# Patient Record
Sex: Female | Born: 1965 | Race: White | Hispanic: No | Marital: Married | Smoking: Never smoker
Health system: Southern US, Community
[De-identification: ages and names within clinical notes are randomized; demographics above are authoritative.]

## PROBLEM LIST (undated history)

## (undated) DIAGNOSIS — K219 Gastro-esophageal reflux disease without esophagitis: Secondary | ICD-10-CM

## (undated) DIAGNOSIS — D249 Benign neoplasm of unspecified breast: Secondary | ICD-10-CM

## (undated) DIAGNOSIS — T7840XA Allergy, unspecified, initial encounter: Secondary | ICD-10-CM

## (undated) DIAGNOSIS — F329 Major depressive disorder, single episode, unspecified: Secondary | ICD-10-CM

## (undated) DIAGNOSIS — F419 Anxiety disorder, unspecified: Secondary | ICD-10-CM

## (undated) DIAGNOSIS — F32A Depression, unspecified: Secondary | ICD-10-CM

## (undated) DIAGNOSIS — Z803 Family history of malignant neoplasm of breast: Secondary | ICD-10-CM

## (undated) DIAGNOSIS — Z9189 Other specified personal risk factors, not elsewhere classified: Secondary | ICD-10-CM

## (undated) DIAGNOSIS — G43909 Migraine, unspecified, not intractable, without status migrainosus: Secondary | ICD-10-CM

## (undated) HISTORY — DX: Anxiety disorder, unspecified: F41.9

## (undated) HISTORY — PX: TONSILLECTOMY AND ADENOIDECTOMY: SHX28

## (undated) HISTORY — DX: Allergy, unspecified, initial encounter: T78.40XA

## (undated) HISTORY — DX: Depression, unspecified: F32.A

## (undated) HISTORY — DX: Migraine, unspecified, not intractable, without status migrainosus: G43.909

## (undated) HISTORY — DX: Major depressive disorder, single episode, unspecified: F32.9

## (undated) HISTORY — DX: Benign neoplasm of unspecified breast: D24.9

## (undated) HISTORY — DX: Other specified personal risk factors, not elsewhere classified: Z91.89

## (undated) HISTORY — DX: Gastro-esophageal reflux disease without esophagitis: K21.9

## (undated) HISTORY — PX: INTRAUTERINE DEVICE INSERTION: SHX323

## (undated) HISTORY — DX: Family history of malignant neoplasm of breast: Z80.3

---

## 1999-01-23 ENCOUNTER — Other Ambulatory Visit: Admission: RE | Admit: 1999-01-23 | Discharge: 1999-01-23 | Payer: Self-pay | Admitting: Obstetrics and Gynecology

## 1999-04-25 ENCOUNTER — Encounter: Payer: Self-pay | Admitting: Obstetrics and Gynecology

## 1999-04-25 ENCOUNTER — Ambulatory Visit (HOSPITAL_COMMUNITY): Admission: RE | Admit: 1999-04-25 | Discharge: 1999-04-25 | Payer: Self-pay | Admitting: Obstetrics and Gynecology

## 1999-04-25 ENCOUNTER — Encounter (INDEPENDENT_AMBULATORY_CARE_PROVIDER_SITE_OTHER): Payer: Self-pay | Admitting: Specialist

## 1999-05-19 DIAGNOSIS — D249 Benign neoplasm of unspecified breast: Secondary | ICD-10-CM

## 1999-05-19 HISTORY — DX: Benign neoplasm of unspecified breast: D24.9

## 1999-08-03 ENCOUNTER — Inpatient Hospital Stay (HOSPITAL_COMMUNITY): Admission: AD | Admit: 1999-08-03 | Discharge: 1999-08-05 | Payer: Self-pay | Admitting: Obstetrics and Gynecology

## 1999-08-03 ENCOUNTER — Encounter: Payer: Self-pay | Admitting: Obstetrics and Gynecology

## 1999-08-07 ENCOUNTER — Encounter: Admission: RE | Admit: 1999-08-07 | Discharge: 1999-11-05 | Payer: Self-pay | Admitting: Obstetrics and Gynecology

## 1999-09-12 ENCOUNTER — Other Ambulatory Visit: Admission: RE | Admit: 1999-09-12 | Discharge: 1999-09-12 | Payer: Self-pay | Admitting: Obstetrics and Gynecology

## 2000-10-27 ENCOUNTER — Other Ambulatory Visit: Admission: RE | Admit: 2000-10-27 | Discharge: 2000-10-27 | Payer: Self-pay | Admitting: Obstetrics and Gynecology

## 2001-07-11 ENCOUNTER — Other Ambulatory Visit: Admission: RE | Admit: 2001-07-11 | Discharge: 2001-07-11 | Payer: Self-pay | Admitting: Obstetrics and Gynecology

## 2002-01-14 ENCOUNTER — Inpatient Hospital Stay (HOSPITAL_COMMUNITY): Admission: AD | Admit: 2002-01-14 | Discharge: 2002-01-16 | Payer: Self-pay | Admitting: Obstetrics and Gynecology

## 2002-02-15 ENCOUNTER — Other Ambulatory Visit: Admission: RE | Admit: 2002-02-15 | Discharge: 2002-02-15 | Payer: Self-pay | Admitting: Obstetrics and Gynecology

## 2003-01-25 ENCOUNTER — Other Ambulatory Visit: Admission: RE | Admit: 2003-01-25 | Discharge: 2003-01-25 | Payer: Self-pay | Admitting: Gynecology

## 2003-09-05 ENCOUNTER — Ambulatory Visit (HOSPITAL_COMMUNITY): Admission: RE | Admit: 2003-09-05 | Discharge: 2003-09-05 | Payer: Self-pay | Admitting: Family Medicine

## 2005-05-07 ENCOUNTER — Other Ambulatory Visit: Admission: RE | Admit: 2005-05-07 | Discharge: 2005-05-07 | Payer: Self-pay | Admitting: Obstetrics and Gynecology

## 2005-06-01 ENCOUNTER — Encounter: Admission: RE | Admit: 2005-06-01 | Discharge: 2005-06-01 | Payer: Self-pay | Admitting: Obstetrics & Gynecology

## 2005-11-20 ENCOUNTER — Encounter: Admission: RE | Admit: 2005-11-20 | Discharge: 2005-11-20 | Payer: Self-pay | Admitting: Obstetrics & Gynecology

## 2006-05-17 ENCOUNTER — Other Ambulatory Visit: Admission: RE | Admit: 2006-05-17 | Discharge: 2006-05-17 | Payer: Self-pay | Admitting: Obstetrics & Gynecology

## 2007-05-09 ENCOUNTER — Encounter: Admission: RE | Admit: 2007-05-09 | Discharge: 2007-05-09 | Payer: Self-pay | Admitting: Obstetrics & Gynecology

## 2007-06-06 ENCOUNTER — Other Ambulatory Visit: Admission: RE | Admit: 2007-06-06 | Discharge: 2007-06-06 | Payer: Self-pay | Admitting: Obstetrics & Gynecology

## 2008-05-14 ENCOUNTER — Encounter: Admission: RE | Admit: 2008-05-14 | Discharge: 2008-05-14 | Payer: Self-pay | Admitting: Obstetrics & Gynecology

## 2009-06-03 ENCOUNTER — Encounter: Admission: RE | Admit: 2009-06-03 | Discharge: 2009-06-03 | Payer: Self-pay | Admitting: Obstetrics & Gynecology

## 2010-07-07 ENCOUNTER — Other Ambulatory Visit: Payer: Self-pay | Admitting: Obstetrics & Gynecology

## 2010-07-07 DIAGNOSIS — Z1231 Encounter for screening mammogram for malignant neoplasm of breast: Secondary | ICD-10-CM

## 2010-07-17 ENCOUNTER — Ambulatory Visit
Admission: RE | Admit: 2010-07-17 | Discharge: 2010-07-17 | Disposition: A | Discharge status: Home / Self Care | Payer: BC Managed Care – PPO | Source: Ambulatory Visit | Attending: Obstetrics & Gynecology | Admitting: Obstetrics & Gynecology

## 2010-07-17 DIAGNOSIS — Z1231 Encounter for screening mammogram for malignant neoplasm of breast: Secondary | ICD-10-CM

## 2011-06-19 ENCOUNTER — Other Ambulatory Visit: Payer: Self-pay | Admitting: Obstetrics & Gynecology

## 2011-06-19 DIAGNOSIS — Z1231 Encounter for screening mammogram for malignant neoplasm of breast: Secondary | ICD-10-CM

## 2011-08-19 ENCOUNTER — Ambulatory Visit
Admission: RE | Admit: 2011-08-19 | Discharge: 2011-08-19 | Disposition: A | Discharge status: Home / Self Care | Payer: BC Managed Care – PPO | Source: Ambulatory Visit | Attending: Obstetrics & Gynecology | Admitting: Obstetrics & Gynecology

## 2011-08-19 DIAGNOSIS — Z1231 Encounter for screening mammogram for malignant neoplasm of breast: Secondary | ICD-10-CM

## 2011-12-17 ENCOUNTER — Ambulatory Visit (INDEPENDENT_AMBULATORY_CARE_PROVIDER_SITE_OTHER): Payer: BC Managed Care – PPO | Admitting: Family Medicine

## 2011-12-17 ENCOUNTER — Encounter: Payer: Self-pay | Admitting: Family Medicine

## 2011-12-17 VITALS — BP 130/78 | HR 60 | Ht 66.25 in | Wt 122.0 lb

## 2011-12-17 DIAGNOSIS — R5383 Other fatigue: Secondary | ICD-10-CM

## 2011-12-17 DIAGNOSIS — Z0000 Encounter for general adult medical examination without abnormal findings: Secondary | ICD-10-CM

## 2011-12-17 DIAGNOSIS — Z8349 Family history of other endocrine, nutritional and metabolic diseases: Secondary | ICD-10-CM

## 2011-12-17 DIAGNOSIS — Z83438 Family history of other disorder of lipoprotein metabolism and other lipidemia: Secondary | ICD-10-CM

## 2011-12-17 DIAGNOSIS — R5381 Other malaise: Secondary | ICD-10-CM

## 2011-12-17 LAB — CBC WITH DIFFERENTIAL/PLATELET
Basophils Absolute: 0.1 10*3/uL (ref 0.0–0.1)
Eosinophils Absolute: 0.3 10*3/uL (ref 0.0–0.7)
Eosinophils Relative: 5 % (ref 0–5)
HCT: 39 % (ref 36.0–46.0)
MCH: 30.7 pg (ref 26.0–34.0)
MCHC: 33.8 g/dL (ref 30.0–36.0)
Platelets: 165 10*3/uL (ref 150–400)
RDW: 12.9 % (ref 11.5–15.5)

## 2011-12-17 LAB — POCT URINALYSIS DIPSTICK
Ketones, UA: NEGATIVE
Protein, UA: NEGATIVE
Spec Grav, UA: <=1.005
Urobilinogen, UA: NEGATIVE

## 2011-12-17 NOTE — Progress Notes (Signed)
Chief Complaint  Patient presents with  . Annual Exam    new pt physical no pap. pt had eyes check last month by eye doctor. last pap smear was done in 8/12. mammogram done back in feburary   She has no specific complaints or concerns.  Health maintenance: There is no immunization history on file for this patient. Gets flu shots yearly. Recalls having vaccines through GYN about 6-7 years ago Last Pap smear: 12/2010, has appt this month with GYN Last mammogram: yearly, done in 06/2011 Last colonoscopy: never Last DEXA: never Dentist: twice yearly Ophtho: yearly, went in June Exercise: runs 5x/week x 30 minutes Had cholesterol checked though her GYN, it was good, but may have been about 5 years ago.  Past Medical History  Diagnosis Date  . Migraine     in her 20's, improved after having children    Past Surgical History  Procedure Date  . Tonsillectomy and adenoidectomy     History   Social History  . Marital Status: Married    Spouse Name: N/A    Number of Children: 2  . Years of Education: N/A   Occupational History  . 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 Smoking status: Never Smoker   . Smokeless tobacco: Never Used  . Alcohol Use: Yes     0-2 drinks/week  . Drug Use: No  . Sexually Active: Yes -- Female partner(s)    Birth Control/ Protection: IUD     paragard   Other Topics Concern  . 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 Married    Family History  Problem Relation Age of Onset  . Hypertension Mother   . Breast cancer Mother     19's  . Thyroid disease Mother   . Hyperlipidemia Mother   . Hyperlipidemia Father   . Heart disease Maternal Uncle   . Heart disease Paternal Uncle   . Breast cancer Other    Current outpatient prescriptions:aspirin-acetaminophen-caffeine (EXCEDRIN MIGRAINE) 250-250-65 MG per tablet, Take 1 tablet by mouth every 6 (six) hours as  needed., Disp: , Rfl: ;  Calcium-Vitamin D-Vitamin K 500-100-40 MG-UNT-MCG CHEW, Chew by mouth daily., Disp: , Rfl: ;  Flaxseed, Linseed, 1300 MG CAPS, Take by mouth., Disp: , Rfl: ;  ibuprofen (ADVIL,MOTRIN) 200 MG tablet, Take 200 mg by mouth every 6 (six) hours as needed., Disp: , Rfl:  Multiple Vitamin (MULTI-VITAMIN) tablet, Take 1 tablet by mouth daily., Disp: , Rfl:   No Known Allergies  ROS:  The patient denies anorexia, fever, weight changes,  vision changes, decreased hearing, ear pain, sore throat, breast concerns, chest pain, palpitations, dizziness, syncope, dyspnea on exertion, cough, swelling, nausea, vomiting, diarrhea, constipation, abdominal pain, melena, hematochezia, indigestion/heartburn, hematuria, incontinence, dysuria, irregular menstrual cycles, vaginal discharge, odor or itch, genital lesions, joint pains, numbness, tingling, weakness, tremor, suspicious skin lesions, depression, anxiety, abnormal bleeding/bruising, or enlarged lymph nodes. +occasional mild headaches, relieved by Excedrin migraine.  Hasn't had a full migraine in years. Sometimes is very fatigued--more during school year, likely related to inadequate amount of sleep  PHYSICAL EXAM: BP 130/78  Pulse 60  Ht 5' 6.25" (1.683 m)  Wt 122 lb (55.339 kg)  BMI 19.54 kg/m2  General Appearance:    Alert, cooperative, no distress, appears stated age  Head:    Normocephalic, without obvious abnormality, atraumatic  Eyes:    PERRL, conjunctiva/corneas clear, EOM's intact, fundi  benign  Ears:    Normal TM's and external ear canals  Nose:   Nares normal, mucosa normal, no drainage or sinus   tenderness  Throat:   Lips, mucosa, and tongue normal; teeth and gums normal  Neck:   Supple, no lymphadenopathy;  thyroid:  no   enlargement/tenderness/nodules; no carotid   bruit or JVD  Back:    Spine nontender, no curvature, ROM normal, no CVA     tenderness  Lungs:     Clear to auscultation bilaterally without wheezes,  rales or     ronchi; respirations unlabored  Chest Wall:    No tenderness or deformity   Heart:    Regular rate and rhythm, S1 and S2 normal, no murmur, rub   or gallop  Breast Exam:    Deferred to GYN  Abdomen:     Soft, non-tender, nondistended, normoactive bowel sounds,    no masses, no hepatosplenomegaly  Genitalia:    Deferred to GYN     Extremities:   No clubbing, cyanosis or edema  Pulses:   2+ and symmetric all extremities  Skin:   Skin color, texture, turgor normal, no rashes or lesions  Lymph nodes:   Cervical, supraclavicular, and axillary nodes normal  Neurologic:   CNII-XII intact, normal strength, sensation and gait; reflexes 2+ and symmetric throughout          Psych:   Normal mood, affect, hygiene and grooming.     ASSESSMENT/PLAN: 1. Routine general medical examination at a health care facility  POCT Urinalysis Dipstick  2. Other malaise and fatigue  Comprehensive metabolic panel, CBC with Differential, Vitamin D 25 hydroxy, TSH  3. Family history of hyperlipidemia  Lipid panel   Check GYN records for Tdap. If not UTD, return for nurse visit (vs getting at GYN appt later this month)  Discussed monthly self breast exams and yearly mammograms after the age of 57; at least 30 minutes of aerobic activity at least 5 days/week; proper sunscreen use reviewed; healthy diet, including goals of calcium and vitamin D intake and alcohol recommendations (less than or equal to 1 drink/day) reviewed; regular seatbelt use; changing batteries in smoke detectors.  Immunization recommendations discussed.  Colonoscopy recommendations reviewed--age 35 unless family h/o adenomatous polyps or cancers  F/u prn

## 2011-12-17 NOTE — Patient Instructions (Signed)

## 2011-12-18 ENCOUNTER — Encounter: Payer: Self-pay | Admitting: Family Medicine

## 2011-12-18 LAB — COMPREHENSIVE METABOLIC PANEL
Albumin: 4.2 g/dL (ref 3.5–5.2)
BUN: 13 mg/dL (ref 6–23)
CO2: 27 mEq/L (ref 19–32)
Calcium: 8.8 mg/dL (ref 8.4–10.5)
Chloride: 105 mEq/L (ref 96–112)
Creat: 0.76 mg/dL (ref 0.50–1.10)
Glucose, Bld: 71 mg/dL (ref 70–99)
Sodium: 140 mEq/L (ref 135–145)
Total Bilirubin: 0.4 mg/dL (ref 0.3–1.2)

## 2011-12-18 LAB — VITAMIN D 25 HYDROXY (VIT D DEFICIENCY, FRACTURES): Vit D, 25-Hydroxy: 62 ng/mL (ref 30–89)

## 2011-12-18 LAB — LIPID PANEL
Cholesterol: 144 mg/dL (ref 0–200)
HDL: 54 mg/dL (ref >39)
LDL Cholesterol: 83 mg/dL (ref 0–99)

## 2012-11-02 ENCOUNTER — Ambulatory Visit: Payer: Self-pay | Admitting: Certified Nurse Midwife

## 2012-11-02 ENCOUNTER — Encounter: Payer: Self-pay | Admitting: Certified Nurse Midwife

## 2012-11-03 ENCOUNTER — Encounter: Payer: Self-pay | Admitting: Certified Nurse Midwife

## 2012-11-03 ENCOUNTER — Ambulatory Visit (INDEPENDENT_AMBULATORY_CARE_PROVIDER_SITE_OTHER): Payer: BC Managed Care – PPO | Admitting: Certified Nurse Midwife

## 2012-11-03 VITALS — BP 98/60 | HR 58 | Temp 97.9°F | Resp 12 | Ht 65.75 in | Wt 115.0 lb

## 2012-11-03 DIAGNOSIS — N390 Urinary tract infection, site not specified: Secondary | ICD-10-CM

## 2012-11-03 LAB — POCT URINALYSIS DIPSTICK: Leukocytes, UA: NEGATIVE

## 2012-11-03 MED ORDER — NITROFURANTOIN MONOHYD MACRO 100 MG PO CAPS: 100.0000 mg | ORAL_CAPSULE | Freq: Two times a day (BID) | ORAL | Status: DC

## 2012-11-03 NOTE — Progress Notes (Signed)
S:  @46  y.o.Married Caucasian female presents with UTI  symptoms of dysuria, urinary frequency, urinary urgency for the past 3 days, which onset occurred after sexually activity. Symptoms are better today,but still some frequency, but no pain now. Denies fever, chills,headache,or back pain. Last UTI was 2009 with E.Coli, had similar occurrence last year but resolved without medication use. ROS: feels well  O alert, oriented to person, place, and time   well developed and well nourished healthy female  Exam: abdomen: non tender, negative suprapubic  CVAT: negative bilateral  Pelvic exam: External genital: normal, no lesions  BUS/bladder: non tender, urinary meatus slightly red, nontender  Vagina: normal, normal discharge  Cervix: normal, non tender IUD string noted  Uterus:normal, non tender  Adnexa: normal, non tender, no masses   Diagnostic Test:    Urinalysis: essentially negative, Clear   urine culture, urine micro  Assessment:Normal pelvic exam  Possible post coital UTI History of E.Coli post coital UTI     Plan:Reviewed findings of possible UTI, since symptoms are improving Rx Macrobid see order, Discussed with patient to take one po today and hold prescription if symptoms have resolved and not increasing. If culture shows positive will treat. Discussed post coital use to prevent infection. Questions addressed. Aware of UTI warning signs and symptoms.   RVprn      Reviewed, TL

## 2012-11-04 LAB — URINALYSIS, MICROSCOPIC ONLY: Crystals: NONE SEEN

## 2012-11-24 ENCOUNTER — Other Ambulatory Visit: Payer: Self-pay | Admitting: Obstetrics & Gynecology

## 2012-11-24 DIAGNOSIS — Z1231 Encounter for screening mammogram for malignant neoplasm of breast: Secondary | ICD-10-CM

## 2012-12-14 ENCOUNTER — Ambulatory Visit (HOSPITAL_COMMUNITY)
Admission: RE | Admit: 2012-12-14 | Discharge: 2012-12-14 | Disposition: A | Discharge status: Home / Self Care | Payer: BC Managed Care – PPO | Source: Ambulatory Visit | Attending: Obstetrics & Gynecology | Admitting: Obstetrics & Gynecology

## 2012-12-14 DIAGNOSIS — Z1231 Encounter for screening mammogram for malignant neoplasm of breast: Secondary | ICD-10-CM | POA: Insufficient documentation

## 2012-12-15 ENCOUNTER — Other Ambulatory Visit: Payer: Self-pay | Admitting: Obstetrics & Gynecology

## 2012-12-15 DIAGNOSIS — R928 Other abnormal and inconclusive findings on diagnostic imaging of breast: Secondary | ICD-10-CM

## 2012-12-22 ENCOUNTER — Ambulatory Visit
Admission: RE | Admit: 2012-12-22 | Discharge: 2012-12-22 | Disposition: A | Discharge status: Home / Self Care | Payer: BC Managed Care – PPO | Source: Ambulatory Visit | Attending: Obstetrics & Gynecology | Admitting: Obstetrics & Gynecology

## 2012-12-22 ENCOUNTER — Other Ambulatory Visit: Payer: Self-pay | Admitting: Obstetrics & Gynecology

## 2012-12-22 DIAGNOSIS — R921 Mammographic calcification found on diagnostic imaging of breast: Secondary | ICD-10-CM

## 2012-12-22 DIAGNOSIS — R928 Other abnormal and inconclusive findings on diagnostic imaging of breast: Secondary | ICD-10-CM

## 2013-01-02 ENCOUNTER — Ambulatory Visit
Admission: RE | Admit: 2013-01-02 | Discharge: 2013-01-02 | Disposition: A | Discharge status: Home / Self Care | Payer: BC Managed Care – PPO | Source: Ambulatory Visit | Attending: Obstetrics & Gynecology | Admitting: Obstetrics & Gynecology

## 2013-01-02 DIAGNOSIS — R921 Mammographic calcification found on diagnostic imaging of breast: Secondary | ICD-10-CM

## 2013-01-02 HISTORY — PX: BREAST BIOPSY: SHX20

## 2013-08-10 ENCOUNTER — Encounter: Payer: Self-pay | Admitting: Family Medicine

## 2013-08-10 ENCOUNTER — Ambulatory Visit (INDEPENDENT_AMBULATORY_CARE_PROVIDER_SITE_OTHER): Payer: BC Managed Care – PPO | Admitting: Family Medicine

## 2013-08-10 VITALS — BP 114/76 | HR 68 | Temp 98.2°F | Ht 65.5 in | Wt 119.0 lb

## 2013-08-10 DIAGNOSIS — R0600 Dyspnea, unspecified: Secondary | ICD-10-CM

## 2013-08-10 DIAGNOSIS — R0989 Other specified symptoms and signs involving the circulatory and respiratory systems: Secondary | ICD-10-CM

## 2013-08-10 DIAGNOSIS — R0609 Other forms of dyspnea: Secondary | ICD-10-CM

## 2013-08-10 NOTE — Progress Notes (Signed)
Chief Complaint  Patient presents with  . Chest Pain    feels like she cannot take a "full breath" x 1 week. Does have a headache today but she states that she does get HA's frequently.    Over the last week she has had a slight discomfort in her chest, like when you start to get a URI.  She kept waiting for other symptoms to start (like runny nose, cough). She feels like she can't take a full deep breath.  This morning she felt like her pulse was faster than normal.  She felt "weird" when taking her daughters to the dermatologist, so she had her BP checked.  BP was checked multiple times, and was a little high.  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  She denies heartburn, indigestion, belching.   She has a headache at the top of her head, 5-6/10 intensity.  Not different from typical headaches that she gets, which usually responds nicely to excedrin.  She does think she clenches her teeth intermittently.  Past Medical History  Diagnosis Date  . Migraine     in her 20's, improved after having children  . Adenoma of breast     right lactational adenoma   Past Surgical History  Procedure Laterality Date  . Tonsillectomy and adenoidectomy    . Intrauterine device insertion      inserted 7/09   History   Social History  . Marital Status: Married    Spouse Name: N/A    Number of Children: 2  . Years of Education: N/A   Occupational History  . 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 Smoking status: Never Smoker   . Smokeless tobacco: Never Used  . Alcohol Use: Yes     Comment: 0-2 drinks/week  . Drug Use: No  . Sexual Activity: Yes    Partners: Male    Birth Control/ Protection: IUD     Comment: paragard   Other Topics Concern  . 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  Calcium-Vitamin D-Vitamin K 539-767-34 MG-UNT-MCG CHEW Chew by mouth daily.  . Flaxseed, Linseed, 1300 MG CAPS Take by mouth.  . Multiple Vitamin (MULTI-VITAMIN) tablet Take 1 tablet by mouth daily.  Marland Kitchen PARAGARD INTRAUTERINE COPPER IU by Intrauterine route.  Marland Kitchen aspirin-acetaminophen-caffeine (EXCEDRIN MIGRAINE) 250-250-65 MG per tablet Take 1 tablet by mouth every 6 (six) hours as needed.  Marland Kitchen ibuprofen (ADVIL,MOTRIN) 200 MG tablet Take 200 mg by mouth every 6 (six) hours as needed.  . [DISCONTINUED] nitrofurantoin, macrocrystal-monohydrate, (MACROBID) 100 MG capsule Take 1 capsule (100 mg total) by mouth 2 (two) times daily.   No Known Allergies  ROS: Denies fevers, chills, nausea, vomiting, diarrhea.  Denies edema, calf pain, no recent surgery or inactivity. No family h/o DVT's.   No sick contacts.  No allergy symptoms, or URI, cough. No bleeding, bruising, rashes, joint pain, myalgias. No palpitations  PHYSICAL EXAM: BP 140/88  Pulse 68  Temp(Src) 98.2 F (36.8 C) (Oral)  Ht 5' 5.5" (1.664 m)  Wt 119 lb (53.978 kg)  BMI 19.49 kg/m2  SpO2 98%  LMP 07/24/2013 114/76 on repeat by MD Well developed, pleasant female, in no distress.  Appears comfortable HEENT:  PERRL, EOMI, conjunctiva clear.  TM's and EAC's normal.  Nasal mucosa mod edematous, no erythema or  drainage.  Sinuses nontender.  OP clear Neck: no lymphadenopathy, thyromegaly or mass Heart: regular rate and rhythm without murmur Chest: nontender to palpation Abdomen: soft, nontender, no organomegaly or mass. Extremities: no edema, nontender, negative Homan Neuro: alert and oriented, normal strength, gait Psych: normal mood, affect, hygiene and grooming.   EKG:  NSR, rate 60.  No abnormalities noted  ASSESSMENT/PLAN:  Dyspnea - Plan: EKG 12-Lead  Your lung exam was normal.  Only finding was some nasal congestion, so there might be some drainage causing chest congestion for your symptoms.  You can see if mucinex helps (and  or claritin for allergies if symptoms develop).  Reflux can cause similar symptoms--if you become aware of heartburn or reflux, you can try Prilosec OTC.  It is very possible that stress is contributing--that should get better in the next few days.  Go to the ER if you get worsening shortness of breath, chest pain, otherwise follow up next week if you aren't improving.

## 2013-08-10 NOTE — Patient Instructions (Addendum)
  Your lung exam was normal.  Only finding was some nasal congestion, so there might be some drainage causing chest congestion for your symptoms.  You can see if mucinex helps.  Reflux can cause similar symptoms--if you become aware of heartburn or reflux, you can try Prilosec OTC.  It is very possible that stress is contributing--that should get better in the next few days.  Go to the ER if you get worsening shortness of breath, chest pain, otherwise follow up next week if you aren't improving.  Your blood pressure was normal on the repeat testing (114/76)

## 2013-11-06 ENCOUNTER — Ambulatory Visit: Payer: BC Managed Care – PPO | Admitting: Nurse Practitioner

## 2013-12-12 ENCOUNTER — Other Ambulatory Visit: Payer: Self-pay

## 2013-12-12 DIAGNOSIS — Z1231 Encounter for screening mammogram for malignant neoplasm of breast: Secondary | ICD-10-CM

## 2013-12-25 ENCOUNTER — Ambulatory Visit
Admission: RE | Admit: 2013-12-25 | Discharge: 2013-12-25 | Disposition: A | Discharge status: Home / Self Care | Payer: BC Managed Care – PPO | Source: Ambulatory Visit

## 2013-12-25 DIAGNOSIS — Z1231 Encounter for screening mammogram for malignant neoplasm of breast: Secondary | ICD-10-CM

## 2013-12-28 ENCOUNTER — Encounter: Payer: Self-pay | Admitting: Nurse Practitioner

## 2013-12-28 ENCOUNTER — Ambulatory Visit (INDEPENDENT_AMBULATORY_CARE_PROVIDER_SITE_OTHER): Payer: BC Managed Care – PPO | Admitting: Nurse Practitioner

## 2013-12-28 VITALS — BP 120/72 | HR 64 | Resp 16 | Ht 66.0 in | Wt 119.0 lb

## 2013-12-28 DIAGNOSIS — Z01419 Encounter for gynecological examination (general) (routine) without abnormal findings: Secondary | ICD-10-CM

## 2013-12-28 DIAGNOSIS — Z803 Family history of malignant neoplasm of breast: Secondary | ICD-10-CM | POA: Insufficient documentation

## 2013-12-28 DIAGNOSIS — Z0000 Encounter for general adult medical examination without abnormal findings: Secondary | ICD-10-CM

## 2013-12-28 LAB — POCT URINALYSIS DIPSTICK
Bilirubin, UA: NEGATIVE
GLUCOSE UA: NEGATIVE
Ketones, UA: NEGATIVE
LEUKOCYTES UA: NEGATIVE
Nitrite, UA: NEGATIVE
PROTEIN UA: NEGATIVE
Urobilinogen, UA: NEGATIVE
pH, UA: 6.5

## 2013-12-28 LAB — HEMOGLOBIN, FINGERSTICK: HEMOGLOBIN, FINGERSTICK: 13.2 g/dL (ref 12.0–16.0)

## 2013-12-28 NOTE — Patient Instructions (Addendum)

## 2013-12-28 NOTE — Progress Notes (Signed)
Patient ID: Annette Byrd, female   DOB: 1966-05-05, 48 y.o.   MRN: [id number] 48 y.o. G1P2002 Married Caucasian Fe here for annual exam.  Menses is regular but last 7-10 days.  She has spotting that goes on several days.   She did have a left breast core biopsy last August which was benign and only calcifications and FCB changes.  Sister age 69 with a new diagnosis of breast cancer.  She lives in [place] and had a Lumpectomy and will be having radiation.  She is not sure but thinks it was stage O and maybe DCIS.  Patient's last menstrual period was 12/21/2013.          Sexually active: Yes.    The current method of family planning is ParaGard IUD insertion 11/2007    Exercising: Yes.    Home exercise routine includes running, upper body and light weight lifting. 5 times per week Smoker:  no  Health Maintenance: Pap:  06/06/12, WNL, neg HR HPV MMG:  12/25/13, Bi-Rads 1:  negative  TDaP:  12/03/05 Labs: HB:  13.2  Urine:  Large RBC (menses)   reports that she has never smoked. She has never used smokeless tobacco. She reports that she drinks about 1.5 ounces of alcohol per week. She reports that she does not use illicit drugs.  Past Medical History  Diagnosis Date  . Migraine     in her 20's, improved after having children  . 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 Tonsillectomy and adenoidectomy    . Intrauterine device insertion      inserted 7/09  . Breast biopsy Left 01/02/2013    calcifications and FBC    Current Outpatient Prescriptions  Medication Sig Dispense Refill  . aspirin-acetaminophen-caffeine (EXCEDRIN MIGRAINE) 250-250-65 MG per tablet Take 1 tablet by mouth every 6 (six) hours as needed.      . Calcium-Vitamin D-Vitamin K 500-100-40 MG-UNT-MCG CHEW Chew by mouth daily.      . Flaxseed, Linseed, 1300 MG CAPS Take by mouth.      Marland Kitchen ibuprofen (ADVIL,MOTRIN) 200 MG tablet Take 200 mg by mouth every 6 (six)  hours as needed.      . Multiple Vitamin (MULTI-VITAMIN) tablet Take 1 tablet by mouth daily.      Marland Kitchen PARAGARD INTRAUTERINE COPPER IU by Intrauterine route.       No current facility-administered medications for this visit.    Family History  Problem Relation Age of Onset  . Hypertension Mother   . Breast cancer Mother     55's  . Thyroid disease Mother   . Hyperlipidemia Mother   . Hyperlipidemia Father   . Heart disease Maternal Uncle   . Heart disease Paternal Uncle   . Breast cancer Other   . Cancer Maternal Grandmother     leukemia    ROS:  Pertinent items are noted in HPI.  Otherwise, a comprehensive ROS was negative.  Exam:   BP 120/72  Pulse 64  Resp 16  Ht _0  (1.676 m)  Wt 119 lb (53.978 kg)  BMI 19.22 kg/m2  LMP 12/21/2013 Height: _1  (167.6 cm)  Ht Readings from Last 3 Encounters:  12/28/13 _2  (1.676 m)  08/10/13 5' 5.5" (1.664 m)  11/03/12 5' 5.75" (1.67 m)    General appearance: alert, cooperative and appears stated age Head: Normocephalic, without obvious abnormality, atraumatic Neck: no adenopathy, supple,  symmetrical, trachea midline and thyroid normal to inspection and palpation Lungs: clear to auscultation bilaterally Breasts: normal appearance, no masses or tenderness, FCB's Heart: regular rate and rhythm Abdomen: soft, non-tender; no masses,  no organomegaly Extremities: extremities normal, atraumatic, no cyanosis or edema Skin: Skin color, texture, turgor normal. No rashes or lesions Lymph nodes: Cervical, supraclavicular, and axillary nodes normal. No abnormal inguinal nodes palpated Neurologic: Grossly normal   Pelvic: External genitalia:  no lesions              Urethra:  normal appearing urethra with no masses, tenderness or lesions              Bartholin's and Skene's: normal                 Vagina: normal appearing vagina with normal color and moderate vaginal bleeding, no lesions              Cervix: anteverted, IUD string  visible              Pap taken: No. Bimanual Exam:  Uterus:  normal size, contour, position, consistency, mobility, non-tender              Adnexa: no mass, fullness, tenderness               Rectovaginal: Confirms               Anus:  normal sphincter tone, no lesions  A:  Well Woman with normal exam  ParaGard IUD for contraception  [facility] of breast cancer with mother (BRCA neg) and sister  (BRCA pending)  P:   Reviewed health and wellness pertinent to exam  Pap smear not taken today  Mammogram is due 8/16  Counseled on breast self exam, mammography screening, adequate intake of calcium and vitamin D, diet and exercise return annually or prn  An After Visit Summary was printed and given to the patient.

## 2014-01-01 NOTE — Progress Notes (Signed)
Encounter reviewed by Dr. Brook Silva.  

## 2014-01-24 ENCOUNTER — Ambulatory Visit: Payer: BC Managed Care – PPO | Admitting: Family Medicine

## 2014-03-19 ENCOUNTER — Encounter: Payer: Self-pay | Admitting: Nurse Practitioner

## 2014-12-06 ENCOUNTER — Other Ambulatory Visit: Payer: Self-pay

## 2014-12-06 DIAGNOSIS — Z1231 Encounter for screening mammogram for malignant neoplasm of breast: Secondary | ICD-10-CM

## 2015-01-16 ENCOUNTER — Ambulatory Visit
Admission: RE | Admit: 2015-01-16 | Discharge: 2015-01-16 | Disposition: A | Discharge status: Home / Self Care | Payer: BC Managed Care – PPO | Source: Ambulatory Visit

## 2015-01-16 DIAGNOSIS — Z1231 Encounter for screening mammogram for malignant neoplasm of breast: Secondary | ICD-10-CM

## 2015-01-25 ENCOUNTER — Telehealth: Payer: Self-pay | Admitting: Emergency Medicine

## 2015-01-25 DIAGNOSIS — Z1239 Encounter for other screening for malignant neoplasm of breast: Secondary | ICD-10-CM

## 2015-01-25 DIAGNOSIS — Z803 Family history of malignant neoplasm of breast: Secondary | ICD-10-CM

## 2015-01-25 DIAGNOSIS — Z9189 Other specified personal risk factors, not elsewhere classified: Secondary | ICD-10-CM

## 2015-01-25 NOTE — Telephone Encounter (Signed)
-----   Message from Kem Boroughs, [place] sent at 01/18/2015  1:33 PM EDT ----- She needs AEX anyway can we get this scheduled and will discuss about MRI.

## 2015-01-25 NOTE — Telephone Encounter (Signed)
-----   Message from Megan Salon, MD sent at 01/18/2015  1:05 PM EDT ----- Please call pt.  Mammogram was negative but risks assessment was done due to her family hx and yearly MRI also recommended.  Is this something she would consider?  May need appt with Patty to discussed.  Sending copy of MMG to Patty for FYI.

## 2015-01-25 NOTE — Telephone Encounter (Signed)
Message left to return call to Yazlyn Wentzel at [phone].    

## 2015-01-28 NOTE — Telephone Encounter (Signed)
Spoke with patient and messages discussed.  Patient agreeable to schedule annual exam. Requests only early morning or late afternoon appointment.  Scheduled for 02/20/15 at 1500 for annual exam.   Patient wishes to move forward with process for scheduling MRI, does not want to wait for appointment. She states she is anxious because of Mother and Sister with history of Breast cancer.   Advised would request Dr. Sabra Heck review and order MRI if okay. Patient will be contacted by [facility].   Dr. Sabra Heck, okay to order and schedule bilateral breast MRI with and without contrast prior to annual exam?

## 2015-01-28 NOTE — Telephone Encounter (Signed)
Yes.  Ok to proceed with MRI as she has recently had a normal MMG.  Thanks.

## 2015-01-29 NOTE — Telephone Encounter (Signed)
Order faxed to [facility] to contact patient to schedule Bilateral Breast MRI with and without contrast.

## 2015-01-30 NOTE — Telephone Encounter (Signed)
[facility] left message on mobile on 01/29/15 to schedule bilateral breast MRI.

## 2015-02-13 NOTE — Telephone Encounter (Signed)
Patient is scheduled for Bilateral Breast MIR with and without contrast for 02/27/15.  Will close encounter.

## 2015-02-19 ENCOUNTER — Telehealth: Payer: Self-pay | Admitting: Nurse Practitioner

## 2015-02-19 NOTE — Telephone Encounter (Signed)
Left message for patient to reschedule AEX which was cancel due to provider being out sick

## 2015-02-20 ENCOUNTER — Ambulatory Visit: Payer: BC Managed Care – PPO | Admitting: Nurse Practitioner

## 2015-02-26 ENCOUNTER — Telehealth: Payer: Self-pay | Admitting: Obstetrics & Gynecology

## 2015-02-26 NOTE — Telephone Encounter (Signed)
Per patients last signed dpr, left the following details in voicemail regarding cancelled MRI originally scheduled for 02/27/15. Informed patient her MRI appointment was cancelled due to insurance denial and appeals process. Actively working on Multimedia programmer. Notified patient appointment cancelled. Left contact information if patient wishes to call back and discuss. If insurance department unavailable, ok to ask for Seth Bake. Staff message to [place] with insurance details. Dr Sabra Heck aware of status.

## 2015-02-27 ENCOUNTER — Inpatient Hospital Stay: Admission: RE | Admit: 2015-02-27 | Payer: BC Managed Care – PPO | Source: Ambulatory Visit

## 2015-03-04 NOTE — Telephone Encounter (Signed)
Patient returning call.

## 2015-03-06 NOTE — Telephone Encounter (Signed)
Spoke with patient. She understands approved and ready to schedule. Patient began asking questions regarding reasoning for MRI. Patient requested reassurance it was not related to recent MMG. Transferred to [facility] to discuss.

## 2015-03-06 NOTE — Telephone Encounter (Signed)
Patient is aware of the approval for the MRI. She still wants to discuss this with [facility]. 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 Please tell the person who answers the phone that she is not testing and to buzz into her classroom.

## 2015-03-06 NOTE — Telephone Encounter (Signed)
Spoke with patient. Patient states she is unsure why she is being sent for a breast MRI. Has concerns something was wrong with her mammogram. Advised patient per note from [facility] 01/25/2015 and mammogram result from 01/17/2015 mammogram was negative for any malignancy. Advised according to the Tyer-Cuzick scoring done by the [facility] her lifetime risk of developing breast cancer is 25%. Due to this percentile with her family history of breast cancer it is recommended that she has additional imaging with a breast MRI. Advised this allows for better imaging of the breast and preventative services due to her family history. Patient is agreeable and verbalizes understanding. She would like to move forward with scheduling when contacted by [place].  Routing to provider for final review. Patient agreeable to disposition. Will close encounter.

## 2015-03-18 ENCOUNTER — Encounter: Payer: Self-pay | Admitting: Obstetrics and Gynecology

## 2015-03-18 ENCOUNTER — Ambulatory Visit (INDEPENDENT_AMBULATORY_CARE_PROVIDER_SITE_OTHER): Payer: BC Managed Care – PPO | Admitting: Obstetrics and Gynecology

## 2015-03-18 VITALS — BP 122/70 | HR 64 | Resp 16 | Ht 65.5 in | Wt 120.0 lb

## 2015-03-18 DIAGNOSIS — Z01419 Encounter for gynecological examination (general) (routine) without abnormal findings: Secondary | ICD-10-CM

## 2015-03-18 DIAGNOSIS — R6882 Decreased libido: Secondary | ICD-10-CM | POA: Diagnosis not present

## 2015-03-18 DIAGNOSIS — Z803 Family history of malignant neoplasm of breast: Secondary | ICD-10-CM | POA: Diagnosis not present

## 2015-03-18 NOTE — Patient Instructions (Signed)

## 2015-03-18 NOTE — Progress Notes (Signed)
Patient ID: Annette Byrd, female   DOB: 11/11/1965, 49 y.o.   MRN: [id number] 49 y.o. [id number] MarriedCaucasianF here for annual exam. She reports irregular bleeding, doesn't write it down. May bleed in the beginning and the end of the month, bleeding for 2-4 days. Saturates a super tampon in 2-3 hours. Not sure if she has some spotting. Has the paragaurd IUD. Sexually active, no pain. She c/o a low libido, able to enjoy intercourse, able to orgasm.   Period Duration (Days): 2-4 days  Period Pattern: (!) Irregular Menstrual Flow: Moderate Menstrual Control: Tampon Dysmenorrhea: (!) Mild Dysmenorrhea Symptoms: Cramping  Patient's last menstrual period was 03/13/2015.          Sexually active: Yes.    The current method of family planning is IUD.    Exercising: Yes.    running, light weights  4-5 days a week Smoker:  no  Health Maintenance: Pap:  06-06-12 WNL History of abnormal Pap:  no MMG:  01-16-15 WNL Colonoscopy:  Never BMD:   Never TDaP:  12-03-05 Gardasil: N/A   reports that she has never smoked. She has never used smokeless tobacco. She reports that she drinks about 1.5 oz of alcohol per week. She reports that she does not use illicit drugs. Teacher, choir. 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   Past Medical History  Diagnosis Date  . Migraine     in her 20's, improved after having children  . 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 Tonsillectomy and adenoidectomy    . Intrauterine device insertion      inserted 7/09  . Breast biopsy Left 01/02/2013    calcifications and FBC    Current Outpatient Prescriptions  Medication Sig Dispense Refill  . aspirin-acetaminophen-caffeine (EXCEDRIN MIGRAINE) 250-250-65 MG per tablet Take 1 tablet by mouth every 6 (six) hours as needed.    . Calcium-Vitamin D-Vitamin K 500-100-40 MG-UNT-MCG CHEW Chew by mouth daily.    Marland Kitchen ibuprofen (ADVIL,MOTRIN) 200 MG tablet Take 200 mg by mouth  every 6 (six) hours as needed.    . Multiple Vitamin (MULTI-VITAMIN) tablet Take 1 tablet by mouth daily.    Marland Kitchen PARAGARD INTRAUTERINE COPPER IU by Intrauterine route.     No current facility-administered medications for this visit.    Family History  Problem Relation Age of Onset  . Hypertension Mother   . Breast cancer Mother     42's  . Thyroid disease Mother   . Hyperlipidemia Mother   . Hyperlipidemia Father   . Heart disease Maternal Uncle   . Heart disease Paternal Uncle   . Breast cancer Other   . Cancer Maternal Grandmother     leukemia  . Breast cancer Sister 49    2015  Mother was diagnosed with breast cancer at 7, still living. Sister with breast cancer at 22. Sister was BRCA negative, Mom wasn't tested.  The patient has been calculated to have a 25% risks of breast cancer  Review of Systems  Constitutional: Negative.   Eyes: Negative.   Respiratory: Negative.   Cardiovascular: Negative.   Gastrointestinal: Negative.   Endocrine: Negative.   Genitourinary: Positive for menstrual problem.       Menstrual cycle pain, Unscheduled bleeding Loss of sexual interest Night urination Loss of urine with sneeze or cough   Musculoskeletal: Negative.   Skin: Negative.   Allergic/Immunologic: Negative.   Neurological: Positive for headaches.  Psychiatric/Behavioral:  Negative.     Exam:   BP 122/70 mmHg  Pulse 64  Resp 16  Ht 5' 5.5" (1.664 m)  Wt 120 lb (54.432 kg)  BMI 19.66 kg/m2  LMP 03/13/2015  Weight change: _0 @ Height:   Height: 5' 5.5" (166.4 cm)  Ht Readings from Last 3 Encounters:  03/18/15 5' 5.5" (1.664 m)  12/28/13 _1  (1.676 m)  08/10/13 5' 5.5" (1.664 m)    General appearance: alert, cooperative and appears stated age Head: Normocephalic, without obvious abnormality, atraumatic Neck: no adenopathy, supple, symmetrical, trachea midline and thyroid normal to inspection and palpation Lungs: clear to auscultation bilaterally Breasts:  normal appearance, no masses or tenderness Heart: regular rate and rhythm Abdomen: soft, non-tender; bowel sounds normal; no masses,  no organomegaly Extremities: extremities normal, atraumatic, no cyanosis or edema Skin: Skin color, texture, turgor normal. No rashes or lesions Lymph nodes: Cervical, supraclavicular, and axillary nodes normal. No abnormal inguinal nodes palpated Neurologic: Grossly normal   Pelvic: External genitalia:  no lesions              Urethra:  normal appearing urethra with no masses, tenderness or lesions              Bartholins and Skenes: normal                 Vagina: normal appearing vagina with normal color and discharge, no lesions              Cervix: no lesions, IUD string 1 cm               Bimanual Exam:  Uterus:  normal size, contour, position, consistency, mobility, non-tender              Adnexa: no mass, fullness, tenderness               Rectovaginal: Confirms               Anus:  normal sphincter tone, no lesions  Chaperone was present for exam.  A:  Well Woman with normal exam  FH of breast cancer with elevated risk of breast cancer of 25%  Irregular menses  Paragaurd IUD  Low libido  P:   Labs and immunizations with primary MD  Continue BSE  Discussed calcium and vit  No pap this year  Recent normal mammogram, has MRI scheduled  Menstrual calender given, she will send it for review in 3 months  Reading information on libido given, discussed the option of checking a testosterone level

## 2015-03-26 ENCOUNTER — Other Ambulatory Visit: Payer: BC Managed Care – PPO

## 2015-04-17 ENCOUNTER — Ambulatory Visit
Admission: RE | Admit: 2015-04-17 | Discharge: 2015-04-17 | Disposition: A | Discharge status: Home / Self Care | Payer: BC Managed Care – PPO | Source: Ambulatory Visit | Attending: Obstetrics & Gynecology | Admitting: Obstetrics & Gynecology

## 2015-04-17 DIAGNOSIS — Z9189 Other specified personal risk factors, not elsewhere classified: Secondary | ICD-10-CM

## 2015-04-17 DIAGNOSIS — Z1239 Encounter for other screening for malignant neoplasm of breast: Secondary | ICD-10-CM

## 2015-04-17 DIAGNOSIS — Z803 Family history of malignant neoplasm of breast: Secondary | ICD-10-CM

## 2015-04-17 MED ORDER — GADOBENATE DIMEGLUMINE 529 MG/ML IV SOLN
10.0000 mL | Freq: Once | INTRAVENOUS | Status: AC | PRN
  Administered 2015-04-17: 10 mL via INTRAVENOUS

## 2015-04-24 ENCOUNTER — Telehealth: Payer: Self-pay | Admitting: *Deleted

## 2015-04-24 DIAGNOSIS — N6320 Unspecified lump in the left breast, unspecified quadrant: Secondary | ICD-10-CM

## 2015-04-24 NOTE — Telephone Encounter (Signed)
-----   Message from Megan Salon, MD sent at 04/24/2015  8:13 AM EST ----- Please let pt know that there is a benign hamartoma in the right breast.  Hamartomas are encapsulated findings in the breast that are non-tender and contain fat, glandular, and fibrous tissue.  This does not need to be biopsied.  Lymph nodes are all normal.  However, there is a place in the left breast--2.2cm x 78mm, -beneath- an area that has been biopsied in the past that the radiologist feels should be biopsied.  MRI guided biopsy recommended and needs to be scheduled.  Thanks.

## 2015-04-24 NOTE — Telephone Encounter (Signed)
Call to patient. Left message to call back. Call at [phone].

## 2015-04-26 NOTE — Telephone Encounter (Signed)
Call to patient on cell and home numbers. Left message to call back. Can also speak to triage nurse.

## 2015-04-30 NOTE — Telephone Encounter (Signed)
Spoke with patient. Results and message given as seen below from [place] [address]. Patient is agreeable and would like to proceed with MRI guided biopsy at this time. Order placed for MRI guided biopsy of the left breast at [facility]. Patient is aware this will be precerted and she will be contacted by [facility] directly to schedule. Patient is in mammogram hold.  Cc: Theresia Lo

## 2015-05-07 ENCOUNTER — Telehealth: Payer: Self-pay | Admitting: Nurse Practitioner

## 2015-05-07 ENCOUNTER — Other Ambulatory Visit: Payer: Self-pay | Admitting: Obstetrics & Gynecology

## 2015-05-07 DIAGNOSIS — N6320 Unspecified lump in the left breast, unspecified quadrant: Secondary | ICD-10-CM

## 2015-05-07 NOTE — Telephone Encounter (Signed)
Notified [facility] no prior authorization required. They stated they attempted to call patient to schedule and left voicemail. I placed a follow up call to notify patient [facility] ready to schedule and left voicemail with contact information for [facility].

## 2015-05-07 NOTE — Telephone Encounter (Signed)
Insurance currently working to [employer] - mr guided biopsy

## 2015-05-07 NOTE — Telephone Encounter (Signed)
Patient calling she said she is wanting to follow up with Gay Filler in regards to a MRI and some other things. Best # to reach: [phone] (Work)

## 2015-05-07 NOTE — Telephone Encounter (Signed)
Spoke with patient. Reviewed information for scheduling. Patient states she was contacted by [facility] and is scheduled 05/2015.  Patient had questions regarding authorization for previous MRI due to insurance difficulties. Provided patient with authorization number and valid dates. Patient understood and agreeable. No further questions. Ok to close.

## 2015-05-07 NOTE — Telephone Encounter (Signed)
Patient returned your call, patient asked if you would please call her work # [phone]

## 2015-05-21 ENCOUNTER — Other Ambulatory Visit: Payer: Self-pay | Admitting: Obstetrics & Gynecology

## 2015-05-21 ENCOUNTER — Ambulatory Visit
Admission: RE | Admit: 2015-05-21 | Discharge: 2015-05-21 | Disposition: A | Discharge status: Home / Self Care | Payer: BC Managed Care – PPO | Source: Ambulatory Visit | Attending: Obstetrics & Gynecology | Admitting: Obstetrics & Gynecology

## 2015-05-21 DIAGNOSIS — N6320 Unspecified lump in the left breast, unspecified quadrant: Secondary | ICD-10-CM

## 2015-05-21 MED ORDER — GADOBENATE DIMEGLUMINE 529 MG/ML IV SOLN
10.0000 mL | Freq: Once | INTRAVENOUS | Status: AC | PRN
  Administered 2015-05-21: 10 mL via INTRAVENOUS

## 2015-05-27 ENCOUNTER — Telehealth: Payer: Self-pay | Admitting: Emergency Medicine

## 2015-05-27 DIAGNOSIS — R922 Inconclusive mammogram: Secondary | ICD-10-CM

## 2015-05-27 DIAGNOSIS — Z803 Family history of malignant neoplasm of breast: Secondary | ICD-10-CM

## 2015-05-27 NOTE — Telephone Encounter (Signed)
Message left to return call to [facility] at [phone].   Out of hold.  04 Recall for 6 months for Breast MRI placed.

## 2015-05-27 NOTE — Telephone Encounter (Signed)
-----   Message from Megan Salon, MD sent at 05/22/2015  4:06 PM EST ----- Pt went for MRI guided biopsy today and MRI appeared normal.  She is to have follow up in six month with MRI.  Put in MMG hold for [place] to schedule this for her and out of current hold.  Can you call and make sure she doesn't have any questions?  Thanks.

## 2015-05-27 NOTE — Telephone Encounter (Signed)
Patient returned call. She states she does not have any questions regarding follow up and appreciates our offices follow up.   She is advised that MRI has been ordered and she can schedule at her convenience. Patient agreeable.  Routing to provider for final review. Patient agreeable to disposition. Will close encounter.

## 2015-11-22 ENCOUNTER — Telehealth: Payer: Self-pay | Admitting: Nurse Practitioner

## 2015-11-22 DIAGNOSIS — R922 Inconclusive mammogram: Secondary | ICD-10-CM

## 2015-11-22 DIAGNOSIS — Z803 Family history of malignant neoplasm of breast: Secondary | ICD-10-CM

## 2015-11-22 NOTE — Telephone Encounter (Signed)
Patient wanting to check the status of a precert for a breast mri.

## 2015-12-07 ENCOUNTER — Ambulatory Visit
Admission: RE | Admit: 2015-12-07 | Discharge: 2015-12-07 | Disposition: A | Discharge status: Home / Self Care | Payer: BC Managed Care – PPO | Source: Ambulatory Visit | Attending: Obstetrics & Gynecology | Admitting: Obstetrics & Gynecology

## 2015-12-07 DIAGNOSIS — R922 Inconclusive mammogram: Secondary | ICD-10-CM

## 2015-12-07 DIAGNOSIS — Z803 Family history of malignant neoplasm of breast: Secondary | ICD-10-CM

## 2015-12-07 MED ORDER — GADOBENATE DIMEGLUMINE 529 MG/ML IV SOLN
10.0000 mL | Freq: Once | INTRAVENOUS | Status: AC | PRN
  Administered 2015-12-07: 10 mL via INTRAVENOUS

## 2015-12-11 ENCOUNTER — Telehealth: Payer: Self-pay | Admitting: *Deleted

## 2015-12-11 DIAGNOSIS — R928 Other abnormal and inconclusive findings on diagnostic imaging of breast: Secondary | ICD-10-CM

## 2015-12-11 NOTE — Telephone Encounter (Signed)
[facility] has left a voicemail for patient to call back to schedule.

## 2015-12-11 NOTE — Telephone Encounter (Signed)
Call to patient on home and cell phone numbers. Left message to call back.

## 2015-12-11 NOTE — Telephone Encounter (Signed)
-----   Message from Megan Salon, MD sent at 12/11/2015  9:48 AM EDT ----- Please notify pt that an MRI guided biopsy is recommended for an 73mm finding in the left breast.  Please schedule.

## 2015-12-11 NOTE — Telephone Encounter (Signed)
Patient returned call. Notified of results and recommendation to proceed with MRI guided biopsy. Patient agreeable and desires to schedule ASAP. Patietn is out of town and requests calls to her cell number. Advised we will proceed with orders and she should hear from [place], phone number given.

## 2015-12-11 NOTE — Telephone Encounter (Signed)
Call to [facility] to request scheduling. No scheduler available at this time. Patient information given to receptionist to contact patient directly via her mobile phone for scheduling.

## 2015-12-12 ENCOUNTER — Other Ambulatory Visit: Payer: Self-pay | Admitting: Obstetrics & Gynecology

## 2015-12-12 DIAGNOSIS — R928 Other abnormal and inconclusive findings on diagnostic imaging of breast: Secondary | ICD-10-CM

## 2015-12-12 NOTE — Telephone Encounter (Signed)
Patient now scheduled for 12/16/15 at 0700 at [facility] at [address], [place] [address].   Will route to Dr. Sabra Heck as update and close encounter. Continued imaging hold.

## 2015-12-16 ENCOUNTER — Ambulatory Visit
Admission: RE | Admit: 2015-12-16 | Discharge: 2015-12-16 | Disposition: A | Discharge status: Home / Self Care | Payer: BC Managed Care – PPO | Source: Ambulatory Visit | Attending: Obstetrics & Gynecology | Admitting: Obstetrics & Gynecology

## 2015-12-16 ENCOUNTER — Other Ambulatory Visit: Payer: Self-pay | Admitting: Obstetrics & Gynecology

## 2015-12-16 DIAGNOSIS — R928 Other abnormal and inconclusive findings on diagnostic imaging of breast: Secondary | ICD-10-CM

## 2015-12-16 MED ORDER — GADOBENATE DIMEGLUMINE 529 MG/ML IV SOLN
10.0000 mL | Freq: Once | INTRAVENOUS | Status: AC | PRN
  Administered 2015-12-16: 10 mL via INTRAVENOUS

## 2015-12-25 ENCOUNTER — Telehealth: Payer: Self-pay | Admitting: Obstetrics & Gynecology

## 2015-12-25 NOTE — Telephone Encounter (Signed)
Spoke with patient. Patient states that she received notification from her insurance company that they are not going to cover her breast MRI or her breast MRI guided biopsy. States that the denial says that she may appeal the denial. Patient is asking if Dr.Miller could write a letter or help with the appeal due to her strong [facility] of breast cancer. She does not have the information to where appeal information needs to be sent, but states she will be able to locate this if Dr.Miller is able to help. Advised I will speak with Dr.Miller regarding appeal and return call with further recommendations. She is agreeable.

## 2015-12-25 NOTE — Telephone Encounter (Signed)
Patient is having some trouble getting her breast MRI's covered by her insurance. Patient is hoping that [facility] may be able to assist in getting them covered.

## 2015-12-27 NOTE — Telephone Encounter (Signed)
Please have pt get EOB copy for [place] so we can review what/why denied.  I'm happy to write a letter for her for this.

## 2015-12-30 NOTE — Telephone Encounter (Signed)
Left message to call Jacolby Risby at [phone]. 

## 2016-01-03 NOTE — Telephone Encounter (Signed)
Spoke with patient. Advised of message as seen below from [place]. She is agreeable and will fax a copy of her EOB to the office for further review with our billing office before letter is written.  Routing to provider for final review. Patient agreeable to disposition. Will close encounter.

## 2016-01-22 ENCOUNTER — Encounter: Payer: Self-pay | Admitting: Family

## 2016-04-23 ENCOUNTER — Telehealth: Payer: Self-pay | Admitting: Family Medicine

## 2016-04-23 DIAGNOSIS — B852 Pediculosis, unspecified: Secondary | ICD-10-CM

## 2016-04-23 NOTE — Telephone Encounter (Signed)
If they haven't tried any treatment yet, the recommendation is to use OTC NIX (permethrin 1%) --apply to hair and scalp and leave on for 10 minutes before rinsing.  Repeat in 1 week. Be sure to clean bedding, brushes, clothing in contact with hair--she can find detailed info on line for how to clean (stuffed animals, etc). It is also important to get a fine tooth comb and remove the nits mechanically

## 2016-04-23 NOTE — Telephone Encounter (Signed)
Called and left detailed message on patient's home phone and cell phone. Work number was [employer] and they were closed.

## 2016-04-23 NOTE — Telephone Encounter (Signed)
Pt called stating that her and both of her kids halle and mara have lice and was wondering if you could send them something in pt can be reached at [phone] and uses [place], [place]

## 2016-04-24 MED ORDER — SPINOSAD 0.9 % EX SUSP: CUTANEOUS | 0 refills | Status: DC

## 2016-04-24 NOTE — Telephone Encounter (Signed)
Called pt and states she will have to call us back

## 2016-04-24 NOTE — Telephone Encounter (Signed)
Pt states that she has tried OTC Nix multiple times. She would like something called in please.   [facility]

## 2016-04-24 NOTE — Telephone Encounter (Signed)
It is possible that they have a resistant form (resistant to [place]), so I'm sending in a prescription for a different medication.  It is topical spinosad, indicated for possible resistance.  Leave in hair for 10 minutes before rinsing, and repeat in a week if live lice present.  The other reason they could be passing it around is if things aren't being properly treated/washed/bagged as directed.  Or if nits aren't removed and not retreated in a week (to cover any missed nits that could have hatched).  I'm sending a bottle in higher quantity (normal quantity is 168mL) so that all 3 can be treated.  Will need to be evaluated if ongoing issues.

## 2016-06-15 ENCOUNTER — Other Ambulatory Visit: Payer: Self-pay | Admitting: Obstetrics & Gynecology

## 2016-06-15 DIAGNOSIS — N6019 Diffuse cystic mastopathy of unspecified breast: Secondary | ICD-10-CM

## 2016-06-15 DIAGNOSIS — Z1231 Encounter for screening mammogram for malignant neoplasm of breast: Secondary | ICD-10-CM

## 2016-06-16 ENCOUNTER — Ambulatory Visit
Admission: RE | Admit: 2016-06-16 | Discharge: 2016-06-16 | Disposition: A | Discharge status: Home / Self Care | Payer: BC Managed Care – PPO | Source: Ambulatory Visit | Attending: Obstetrics & Gynecology | Admitting: Obstetrics & Gynecology

## 2016-06-16 DIAGNOSIS — Z1231 Encounter for screening mammogram for malignant neoplasm of breast: Secondary | ICD-10-CM

## 2016-06-22 ENCOUNTER — Telehealth: Payer: Self-pay

## 2016-06-22 ENCOUNTER — Other Ambulatory Visit: Payer: BC Managed Care – PPO

## 2016-06-22 NOTE — Telephone Encounter (Signed)
Spoke with patient. Advised of message as seen below from [place]. Patient is agreeable and verbalizes understanding. Aware her appointment at [facility] has been cancelled for today. Patient does not desire to reschedule for July at this time. Would like to schedule when it gets closer. Placed in recall for July to be called in June. Spoke with Cherish at the [facility] who states the patient needs to be scheduled in July or will need another mammogram before MRI. If this is done in July will not need another mammogram.  Routing to provider for final review. Patient agreeable to disposition. Will close encounter.

## 2016-06-22 NOTE — Telephone Encounter (Signed)
Left message to call Glennis Borger at [phone]. 

## 2016-06-22 NOTE — Telephone Encounter (Signed)
-----   Message from Megan Salon, MD sent at 06/20/2016 10:46 PM EST ----- Regarding: RE: Breast MRI MRI was not approved by her insurance.  They will approve one in July for a one year follow up but not now.  Please contact pt and breast center.  If pt wants to schedule for in July, please go ahead and do that.  Thanks.  Vinnie Level ----- Message ----- From: Jasmine Awe, RN Sent: 06/18/2016   9:03 AM To: Megan Salon, MD, [facility] Subject: Breast MRI                                     Dr.Miller,  Deloris Ping asked me to assist with clinicals for PA of bilateral breast MRI scheduled with [facility] on 06/22/2016. This does not meet medical necessity based on diagnosis code and information provided. This will need peer to peer when you have a chance please. The contact number to [facility] is [phone]. You will need to provide the patient's name and insurance ID [id number].  Thank you Verline Lema

## 2016-11-17 ENCOUNTER — Telehealth: Payer: Self-pay | Admitting: Obstetrics & Gynecology

## 2016-11-17 NOTE — Telephone Encounter (Signed)
Patient is ready to schedule her breast MRI. Patient was supposed to have this done 05/2016, but due to insurnace she will need to wait until 11/2016. Patient is asking if the order from 05/2016 will be sufficient for her 11/2016 appointment. Patient also asked if she can schedule this MRI herself?

## 2016-11-17 NOTE — Telephone Encounter (Signed)
Spoke with patient, advised breast MRI order is current, will need to obtain pre certification. Advised patient will forward to our insurance/benefit department for precert, will f/u for scheduling once obtained. Patient verbalizes understanding.    Routing to [facility] for pre cert. MRI order is current.    Cc: Dr. Sabra Heck
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
## 2016-12-03 ENCOUNTER — Other Ambulatory Visit (HOSPITAL_COMMUNITY)
Admission: RE | Admit: 2016-12-03 | Discharge: 2016-12-03 | Disposition: A | Discharge status: Home / Self Care | Payer: BC Managed Care – PPO | Source: Ambulatory Visit | Attending: Obstetrics and Gynecology | Admitting: Obstetrics and Gynecology

## 2016-12-03 ENCOUNTER — Encounter: Payer: Self-pay | Admitting: Obstetrics and Gynecology

## 2016-12-03 ENCOUNTER — Ambulatory Visit (INDEPENDENT_AMBULATORY_CARE_PROVIDER_SITE_OTHER): Payer: BC Managed Care – PPO | Admitting: Obstetrics and Gynecology

## 2016-12-03 ENCOUNTER — Telehealth: Payer: Self-pay

## 2016-12-03 VITALS — BP 100/70 | HR 72 | Resp 16 | Ht 65.75 in | Wt 117.0 lb

## 2016-12-03 DIAGNOSIS — N6019 Diffuse cystic mastopathy of unspecified breast: Secondary | ICD-10-CM

## 2016-12-03 DIAGNOSIS — Z9189 Other specified personal risk factors, not elsewhere classified: Secondary | ICD-10-CM

## 2016-12-03 DIAGNOSIS — Z0000 Encounter for general adult medical examination without abnormal findings: Secondary | ICD-10-CM | POA: Diagnosis not present

## 2016-12-03 DIAGNOSIS — F321 Major depressive disorder, single episode, moderate: Secondary | ICD-10-CM | POA: Diagnosis not present

## 2016-12-03 DIAGNOSIS — Z124 Encounter for screening for malignant neoplasm of cervix: Secondary | ICD-10-CM | POA: Insufficient documentation

## 2016-12-03 DIAGNOSIS — Z01419 Encounter for gynecological examination (general) (routine) without abnormal findings: Secondary | ICD-10-CM

## 2016-12-03 DIAGNOSIS — Z803 Family history of malignant neoplasm of breast: Secondary | ICD-10-CM

## 2016-12-03 DIAGNOSIS — N6489 Other specified disorders of breast: Secondary | ICD-10-CM

## 2016-12-03 LAB — POCT URINALYSIS DIPSTICK
BILIRUBIN UA: NEGATIVE
GLUCOSE UA: NEGATIVE
Ketones, UA: NEGATIVE
Leukocytes, UA: NEGATIVE
NITRITE UA: NEGATIVE
PH UA: 5 (ref 5.0–8.0)
Protein, UA: NEGATIVE
RBC UA: NEGATIVE
Urobilinogen, UA: 0.2 E.U./dL

## 2016-12-03 MED ORDER — CITALOPRAM HYDROBROMIDE 20 MG PO TABS: ORAL_TABLET | ORAL | 1 refills | Status: DC

## 2016-12-03 NOTE — Patient Instructions (Signed)

## 2016-12-03 NOTE — Telephone Encounter (Signed)
-----   Message from Salvadore Dom, MD sent at 12/03/2016 12:59 PM EDT ----- Please set this patient up for a breast MRI. H/O breast biopsy (left) one year ago. Radiology recommended f/u MRI in 6 months (insurance wouldn't cover). Please set it up now. She also has a 25% risk of breast cancer and should be getting yearly MRI's for that reason. Thanks

## 2016-12-03 NOTE — Telephone Encounter (Signed)
New order for bilateral breast MRI w wo contrast placed. This has to be precerted after 1/64/3539 as insurance previously declined to cover it until it has been 1 year from her last breast MRI.  Routing to [employer]

## 2016-12-04 LAB — CBC
HEMATOCRIT: 41 % (ref 34.0–46.6)
HEMOGLOBIN: 13.2 g/dL (ref 11.1–15.9)
MCH: 30.9 pg (ref 26.6–33.0)
MCHC: 32.2 g/dL (ref 31.5–35.7)
MCV: 96 fL (ref 79–97)
Platelets: 200 10*3/uL (ref 150–379)
RBC: 4.27 x10E6/uL (ref 3.77–5.28)
RDW: 12.9 % (ref 12.3–15.4)
WBC: 8.1 10*3/uL (ref 3.4–10.8)

## 2016-12-04 LAB — COMPREHENSIVE METABOLIC PANEL
A/G RATIO: 1.8 (ref 1.2–2.2)
ALBUMIN: 4.4 g/dL (ref 3.5–5.5)
ALT: 15 IU/L (ref 0–32)
AST: 20 IU/L (ref 0–40)
Alkaline Phosphatase: 60 IU/L (ref 39–117)
BUN / CREAT RATIO: 12 (ref 9–23)
BUN: 10 mg/dL (ref 6–24)
Bilirubin Total: 0.5 mg/dL (ref 0.0–1.2)
CO2: 24 mmol/L (ref 20–29)
Calcium: 8.6 mg/dL — ABNORMAL LOW (ref 8.7–10.2)
Chloride: 100 mmol/L (ref 96–106)
Creatinine, Ser: 0.82 mg/dL (ref 0.57–1.00)
GFR calc Af Amer: 97 mL/min/{1.73_m2} (ref >59)
GFR, EST NON AFRICAN AMERICAN: 84 mL/min/{1.73_m2} (ref >59)
GLOBULIN, TOTAL: 2.4 g/dL (ref 1.5–4.5)
Glucose: 81 mg/dL (ref 65–99)
POTASSIUM: 3.9 mmol/L (ref 3.5–5.2)
SODIUM: 141 mmol/L (ref 134–144)
Total Protein: 6.8 g/dL (ref 6.0–8.5)

## 2016-12-04 LAB — LIPID PANEL
CHOL/HDL RATIO: 2.5 ratio (ref 0.0–4.4)
Cholesterol, Total: 165 mg/dL (ref 100–199)
HDL: 65 mg/dL (ref >39)
LDL CALC: 89 mg/dL (ref 0–99)
TRIGLYCERIDES: 56 mg/dL (ref 0–149)
VLDL Cholesterol Cal: 11 mg/dL (ref 5–40)

## 2016-12-07 LAB — CYTOLOGY - PAP
DIAGNOSIS: NEGATIVE
HPV: NOT DETECTED

## 2016-12-08 NOTE — Telephone Encounter (Signed)
Spoke with Annette Byrd at [employer] [employer]. Pre-authorization for breast MRI approved through [employer]. Order number [id number]. Good from 7/24-8/22.  Patient has been notified of pre-authorization and that [facility] will contact her to schedule.

## 2016-12-17 NOTE — Telephone Encounter (Signed)
Patient has been scheduled for the recommended Breast MRI  With [facility] on 01/01/17.  Approval has been obtained through [employer] [facility] and noted on appointment schedule for [facility].  Routing to Dr Sabra Heck  cc: Dr Talbert Nan

## 2016-12-22 NOTE — Telephone Encounter (Signed)
Pre-Certification for the scheduled Bilateral Breast MRI on 01/01/17 has been completed and approved. The authorization number is [id number] and has been noted in the appointment notes on the appointment schedule.  Routing to Dr Sabra Heck  cc: Glorianne Manchester, RN

## 2016-12-22 NOTE — Telephone Encounter (Signed)
Per review of EPIC, patient scheduled for bilateral breast MRi on 01/01/17 at [facility].  Annette Byrd, has pre cert been completed?

## 2016-12-22 NOTE — Telephone Encounter (Signed)
Okay to close encounter?  °

## 2016-12-30 ENCOUNTER — Encounter: Payer: Self-pay | Admitting: Genetics

## 2016-12-30 ENCOUNTER — Telehealth: Payer: Self-pay | Admitting: Genetics

## 2016-12-30 NOTE — Telephone Encounter (Signed)
Genetic counseling appt has been scheduled for the pt to see Vicente Males on 8/23 at 11am. Pt aware to arrive 15 minutes early. Address verified. Letter mailed and Wells Guiles from the referring office notified.

## 2017-01-01 ENCOUNTER — Ambulatory Visit
Admission: RE | Admit: 2017-01-01 | Discharge: 2017-01-01 | Disposition: A | Discharge status: Home / Self Care | Payer: BC Managed Care – PPO | Source: Ambulatory Visit | Attending: Obstetrics and Gynecology | Admitting: Obstetrics and Gynecology

## 2017-01-01 DIAGNOSIS — N6489 Other specified disorders of breast: Secondary | ICD-10-CM

## 2017-01-01 DIAGNOSIS — Z9189 Other specified personal risk factors, not elsewhere classified: Secondary | ICD-10-CM

## 2017-01-01 MED ORDER — GADOBENATE DIMEGLUMINE 529 MG/ML IV SOLN
10.0000 mL | Freq: Once | INTRAVENOUS | Status: AC | PRN
  Administered 2017-01-01: 10 mL via INTRAVENOUS

## 2017-01-07 ENCOUNTER — Encounter: Payer: Self-pay | Admitting: Genetics

## 2017-01-07 ENCOUNTER — Encounter: Payer: Self-pay | Admitting: Obstetrics and Gynecology

## 2017-01-07 ENCOUNTER — Ambulatory Visit (HOSPITAL_BASED_OUTPATIENT_CLINIC_OR_DEPARTMENT_OTHER): Payer: BC Managed Care – PPO | Admitting: Genetics

## 2017-01-07 ENCOUNTER — Telehealth: Payer: Self-pay

## 2017-01-07 ENCOUNTER — Ambulatory Visit (INDEPENDENT_AMBULATORY_CARE_PROVIDER_SITE_OTHER): Payer: BC Managed Care – PPO | Admitting: Obstetrics and Gynecology

## 2017-01-07 ENCOUNTER — Other Ambulatory Visit: Payer: BC Managed Care – PPO

## 2017-01-07 VITALS — BP 110/60 | HR 56 | Resp 14 | Wt 117.0 lb

## 2017-01-07 DIAGNOSIS — Z803 Family history of malignant neoplasm of breast: Secondary | ICD-10-CM

## 2017-01-07 DIAGNOSIS — F419 Anxiety disorder, unspecified: Secondary | ICD-10-CM | POA: Diagnosis not present

## 2017-01-07 DIAGNOSIS — F329 Major depressive disorder, single episode, unspecified: Secondary | ICD-10-CM

## 2017-01-07 DIAGNOSIS — Z9189 Other specified personal risk factors, not elsewhere classified: Secondary | ICD-10-CM | POA: Diagnosis not present

## 2017-01-07 DIAGNOSIS — Z7183 Encounter for nonprocreative genetic counseling: Secondary | ICD-10-CM | POA: Diagnosis not present

## 2017-01-07 DIAGNOSIS — Z800 Family history of malignant neoplasm of digestive organs: Secondary | ICD-10-CM | POA: Diagnosis not present

## 2017-01-07 MED ORDER — CITALOPRAM HYDROBROMIDE 40 MG PO TABS: 40.0000 mg | ORAL_TABLET | Freq: Every day | ORAL | 1 refills | Status: DC

## 2017-01-07 NOTE — Progress Notes (Addendum)
REFERRING PROVIDER: Salvadore Dom, MD [address] [place], [place] [address]  PRIMARY PROVIDER:  Rita Ohara, MD  PRIMARY REASON FOR VISIT:  1. Family history of breast cancer   2. Family history of pancreatic cancer     HISTORY OF PRESENT ILLNESS:   Annette Byrd, a 51 y.o. female, was seen for a [place] [facility] cancer genetics consultation at the request of Dr. Talbert Byrd due to a family history of cancer.  Annette Byrd presents to clinic today to discuss the possibility of a hereditary predisposition to cancer, genetic testing, and to further clarify her future cancer risks, as well as potential cancer risks for family members. She was accompanied to her appointment by her husband.   Annette Byrd is a 51 y.o. female with no personal history of cancer. 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    HORMONAL RISK FACTORS:  Menarche was at age 34.  First live birth at age 87.  Ovaries intact: yes.  Hysterectomy: no.  Menopausal status: perimenopausal.  HRT use: 0 years. Colonoscopy: no; not examined. Mammogram within the last year: yes. Number of breast biopsies: 4. Up to date with pelvic exams:  yes. Any excessive radiation exposure in the past:  no  Past Medical History:  Diagnosis Date  . 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 Migraine    in her 20's, improved after having children    Past Surgical History:  Procedure Laterality Date  . BREAST BIOPSY Left 01/02/2013   calcifications and FBC  . INTRAUTERINE DEVICE INSERTION     inserted 7/09  . TONSILLECTOMY AND  ADENOIDECTOMY      Social History   Social History  . Marital status: Married    Spouse name: N/A  . Number of children: 2  . Years of education: N/A   Occupational History  . 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 Smoking status: Never Smoker  . Smokeless tobacco: Never Used  . Alcohol use 1.5 oz/week    3 Standard drinks or equivalent per week     Comment: 0-2 drinks/week  . Drug use: No  . Sexual activity: Yes    Partners: Male    Birth control/ protection: IUD     Comment: paragard inserted 11/2007   Other Topics Concern  . 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 Married     FAMILY HISTORY:  We obtained a detailed, 4-generation family history.  Significant diagnoses are listed below: Family History  Problem Relation Age of Onset  . Hypertension Mother   . Breast cancer Mother 8       treated with lumpectomy and radiation  . Thyroid disease Mother   . Hyperlipidemia Mother   . Hyperlipidemia Father   . Breast cancer Other   . Breast cancer Sister 49       2015 - Byrd (25 gene) negative. Treated with lumpectomy and radiation  . Heart disease Maternal Uncle        d.75  . Heart disease Paternal Uncle   .  Leukemia Maternal Grandmother        d.70s -shortly after diagnosis  . Pancreatic cancer Paternal Grandmother 23       d.70s   Annette Byrd has two daughters, ages 66 and 40. Annette Byrd' only sister was diagnosed with breast cancer at age 48 and was treated with lumpectomy, chemotherapy, and hormone therapy (for a short period of time). Annette Byrd' sister is now doing well at age 71. At the time of her diagnosis, Annette Byrd' sister underwent genetic testing for hereditary cancer syndromes through Annette Byrd 25-gene hereditary cancers panel. She was negative for mutations. A copy of the report is included at the end of this note for reference.  Annette Byrd' mother  is currently 31. She was diagnosed with breast cancer at 2 and was treated with lumpectomy and radiation. Annette Byrd' mother underwent hysterectomy in her 36s, but Annette Byrd does not know if her mother's ovaries were also removed at that time. Annette Byrd has one maternal uncle who died at 58 and was not known to have cancer, though information about this uncle and his children is limited. Annette Byrd' maternal grandmother was diagnosed with leukemia in her 28s and died from the disease shortly after. This grandmother had two sisters who had breast cancer in their late-40s/early-50s. Annette Byrd' maternal grandfather died at 4 without cancers.  Annette Byrd' father is 10 without cancers. He has a twin brother and a brother who is in his mid-69s. Neither have cancers. Annette Byrd' paternal grandmother was diagnosed with pancreatic cancer in her 75s and died shortly after. She declined treatment. Annette Byrd' grandmother was an only child. There is no information available about Ms. Kromer' paternal grandfather.  Patient's maternal ancestors are of Swedish/German descent, and paternal ancestors are of Scottish/English/Welsh/possibly [place] descent. There is no reported Ashkenazi Jewish ancestry. There is no known consanguinity.  GENETIC COUNSELING ASSESSMENT: Annette Byrd is a 51 y.o. female with a family history of breast cancer which is somewhat suggestive of a hereditary cancer syndrome and predisposition to cancer. Furthermore, her paternal grandmother's history of pancreatic cancer and few close, female paternal relatives could also be suggestive of a hereditary cancer syndrome. We, therefore, discussed and recommended the following at today's visit.   DISCUSSION: We reviewed the characteristics, features and inheritance patterns of hereditary cancer syndromes. We also discussed genetic testing, including the appropriate family members to test, the process of testing, insurance coverage and  turn-around-time for results. We discussed the implications of a negative, positive and/or variant of uncertain significant result. We recommended Annette Byrd pursue genetic testing for the Common Hereditary Cancer Panel offered by [facility]. [facility]'s Common Hereditary Cancers Panel includes analysis of the following 46 genes: APC, ATM, AXIN2, BARD1, BMPR1A, BRCA1, BRCA2, BRIP1, CDH1, CDKN2A, CHEK2, CTNNA1, DICER1, EPCAM, GREM1, HOXB13, KIT, MEN1, MLH1, MSH2, MSH3, MSH6, MUTYH, NBN, NF1, NTHL1, PALB2, PDGFRA, PMS2, POLD1, POLE, PTEN, RAD50, RAD51C, RAD51D, SDHA, SDHB, SDHC, SDHD, SMAD4, SMARCA4, STK11, TP53, TSC1, TSC2, and VHL.   Based on Ms. Dena' family history of cancer, she meets medical criteria for genetic testing. Despite that she meets criteria, she may still have an out of pocket cost. We discussed that if her out of pocket cost for testing is over $100, the laboratory will call and confirm whether she wants to proceed with testing.  If the out of pocket cost of testing is less than $100 she will be billed by the genetic testing laboratory.   The Tyrer-Cuzick (IBIS) Breast Cancer Risk  Model was used to estimate Ms. Iannaccone' lifetime breast cancer risk. This model estimates risk through analysis of personal health history and family history. According to this model, her lifetime risk of developing breast cancer is 33.1%. The American Cancer Society recommends consideration of breast MRI screening as an adjunct to mammography for patients at high risk (defined as 20% or greater lifetime risk). This estimation assumes that Ms. Zahradnik' genetic testing is negative. If Ms. Birdwell' genetic testing identifies a mutation, her cancer risks and management will likely be based on the presence of that mutation rather than this model, as it will likely confer a higher risk.    Ms. Mccaffrey has been determined to be at high risk for breast cancer. Therefore, we recommend that she continue annual screening with  mammography and breast MRI. We discussed that Ms. Holleran should discuss her individual situation with her referring physician and determine a breast cancer screening plan with which they are both comfortable.   PLAN: Despite our recommendation, Ms. Morocco did not wish to pursue genetic testing at today's visit due to concerns regarding the limitations of GINA. We understand this decision, and remain available to coordinate genetic testing at any time in the future. Ms. Zertuche should continue to follow the cancer screening guidelines given by her primary healthcare provider.  Based on Ms. Taite' family history, we recommended her mother, who was diagnosed with breast at age 28, have genetic counseling and testing. Ms. Drone states that her mother plans to pursue genetic testing and is waiting for a referral to [place] from her physician. Ms. Kratt was encouraged to have her mother call us directly if we can be of help in coordinating/scheduling an appointment for her. Ms. Bari' mother's name is Chipper Herb and Ms. Cowles gave verbal permission for [place] to discuss information from her own genetic counseling consultation with her mother.   Lastly, we encouraged Ms. Blaustein to remain in contact with cancer genetics annually so that we can continuously update the family history and inform her of any changes in cancer genetics and testing that may be of benefit for this family.   Ms.  Choy questions were answered to her satisfaction today. Our contact information was provided should additional questions or concerns arise. Thank you for the referral and allowing [place] to share in the care of your patient.   Mal Misty, MS, [facility] Certified Naval [email or web address] phone: [phone]  The patient was seen for a total of 40 minutes in face-to-face genetic counseling.  This patient was discussed with Drs. Magrinat, Lindi Adie and/or Burr Medico who agrees with the above.     _______________________________________________________________________ For Office Staff:  Number of people involved in session: 2 Was an Intern/ student involved with case: no  Leisure centre manager (IBIS) Breast Cancer Risk Model Summary   [facility] RESULTS
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
## 2017-01-07 NOTE — Telephone Encounter (Signed)
Patient removed from imaging hold. New order for bilateral breast MRI w wo contrast placed for 1 year so this will remain in the work que for scheduling at that time.

## 2017-01-07 NOTE — Telephone Encounter (Signed)
-----   Message from Salvadore Dom, MD sent at 01/07/2017  2:17 PM EDT ----- I reviewed the results with the patient at her appointment today. She needs a f/u breast MRI in one year. She has an over 20% risk of breast cancer. She will do her mammograms and MRI's about 6 months apart.

## 2017-02-08 ENCOUNTER — Encounter: Payer: Self-pay | Admitting: Obstetrics and Gynecology

## 2017-02-08 ENCOUNTER — Ambulatory Visit (INDEPENDENT_AMBULATORY_CARE_PROVIDER_SITE_OTHER): Payer: BC Managed Care – PPO | Admitting: Obstetrics and Gynecology

## 2017-02-08 VITALS — BP 104/62 | HR 60 | Resp 14 | Wt 122.0 lb

## 2017-02-08 DIAGNOSIS — F418 Other specified anxiety disorders: Secondary | ICD-10-CM | POA: Diagnosis not present

## 2017-02-08 MED ORDER — CITALOPRAM HYDROBROMIDE 40 MG PO TABS: 40.0000 mg | ORAL_TABLET | Freq: Every day | ORAL | 3 refills | Status: DC
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
## 2017-03-08 ENCOUNTER — Other Ambulatory Visit: Payer: BC Managed Care – PPO

## 2017-03-23 ENCOUNTER — Telehealth: Payer: Self-pay | Admitting: Genetic Counselor

## 2017-03-23 ENCOUNTER — Ambulatory Visit: Payer: Self-pay | Admitting: Genetic Counselor

## 2017-03-23 DIAGNOSIS — Z803 Family history of malignant neoplasm of breast: Secondary | ICD-10-CM

## 2017-03-23 DIAGNOSIS — Z1379 Encounter for other screening for genetic and chromosomal anomalies: Secondary | ICD-10-CM

## 2017-03-23 DIAGNOSIS — Z9189 Other specified personal risk factors, not elsewhere classified: Secondary | ICD-10-CM

## 2017-03-23 NOTE — Progress Notes (Signed)
GENETIC TEST RESULTS   Patient Name: Annette Byrd Patient Age: 51 y.o. Encounter Date: 03/23/2017  Referring Provider: Sumner Boast, MD    Annette Byrd underwent genetic testing due to a family history of cancer and concern regarding a hereditary predisposition to cancer in the family. Please refer to the prior Genetics clinic note for more information regarding Annette Byrd's medical and family histories and our assessment at the time.   FAMILY HISTORY:  We obtained a detailed, 4-generation family history.  Significant diagnoses are listed below: Family History  Problem Relation Age of Onset  . Hypertension Mother   . Breast cancer Mother 66       treated with lumpectomy and radiation  . Thyroid disease Mother   . Hyperlipidemia Mother   . Hyperlipidemia Father   . Breast cancer Other   . Breast cancer Sister 49       2015 - myRisk (25 gene) negative. Treated with lumpectomy and radiation  . Heart disease Maternal Uncle        d.75  . Heart disease Paternal Uncle   . Leukemia Maternal Grandmother        d.70s -shortly after diagnosis  . Pancreatic cancer Paternal Grandmother 53       d.70s   The family history is updated with information about the familial CHEK2 pathogenic variant.  Annette Byrd has two daughters, ages 54 and 66. Annette Byrd' only sister was diagnosed with breast cancer at age 48 and was treated with lumpectomy, chemotherapy, and hormone therapy (for a short period of time). Annette Byrd' sister is now doing well at age 68. At the time of her diagnosis, Annette Byrd' sister underwent genetic testing for hereditary cancer syndromes through [facility] myRisk 25-gene hereditary cancers panel. She was negative for mutations.   Annette Byrd' mother is currently 56. She was diagnosed with breast cancer at 70 and was treated with lumpectomy and radiation. Annette Byrd' mother underwent hysterectomy in her 89s, but Annette Byrd does not know if her mother's ovaries were also  removed at that time. Her mother had genetic testing and was found to have a common CHEK2 pathogenic variant.  Annette Byrd has one maternal uncle who died at 46 and was not known to have cancer, though information about this uncle and his children is limited. Annette Byrd' maternal grandmother was diagnosed with leukemia in her 43s and died from the disease shortly after. This grandmother had two sisters who had breast cancer in their late-40s/early-50s. Annette Byrd' maternal grandfather died at 63 without cancers.  Annette Byrd' father is 40 without cancers. He has a twin brother and a brother who is in his mid-19s. Neither have cancers. Annette Byrd' paternal grandmother was diagnosed with pancreatic cancer in her 59s and died shortly after. She declined treatment. Annette Byrd' grandmother was an only child. There is no information available about Annette Byrd' paternal grandfather.  Patient's maternal ancestors are of Swedish/German descent, and paternal ancestors are of Scottish/English/Welsh/possibly [place] descent. There is no reported Ashkenazi Jewish ancestry. There is no known consanguinity.  GENETIC TESTING:  At the time of Annette Byrd's visit, we recommended she pursue genetic testing of the common hereditary cancer panel. The genetic testing was reported out on March 17, 2017 through the Common Hereditary Cancer Panel offered by [facility] identified a single, heterozygous pathogenic gene mutation called CHEK2, c.1100delC (p.Thr367Metfs*26). The Hereditary Gene Panel offered by [facility] includes sequencing and/or deletion duplication testing of the following 47 genes: APC, ATM, AXIN2, BARD1,  BMPR1A, BRCA1, BRCA2, BRIP1, CDH1, CDK4, CDKN2A (p14ARF), CDKN2A (p16INK4a), CHEK2, CTNNA1, DICER1, EPCAM (Deletion/duplication testing only), GREM1 (promoter region deletion/duplication testing only), KIT, MEN1, MLH1, MSH2, MSH3, MSH6, MUTYH, NBN, NF1, NHTL1, PALB2, PDGFRA, PMS2, POLD1, POLE, PTEN, RAD50, RAD51C,  RAD51D, SDHB, SDHC, SDHD, SMAD4, SMARCA4. STK11, TP53, TSC1, TSC2, and VHL.  The following genes were evaluated for sequence changes only: SDHA and HOXB13 c.251G>A variant only.      DISCUSSION: CHEK2 mutations have been found to be associated with an increased risk of breast and other cancers. The estimated cancer risks vary widely and may be influenced by family history. Women with a CHEK2 deleterious mutation have approximately a 24% (no family history of breast cancer) to 48% (strong family history of breast cancer) lifetime risk of breast cancer and up to a 25% risk of a second breast cancer. Men may have an increased risk for female breast cancer of about 1%. Men and women may have an increased risk of colon cancer (~10% lifetime risk). According to the NCCN guidelines, individuals with CHEK2 mutations should consider breast MRI's as a part of regular breast cancer screening, and depending on family history could consider a risk-reducing mastectomy.  Breast cancer screening should begin, for women, at age 79 or 104 years younger than the earliest age of onset.  Colon cancer screening should begin at age 47 and continue every 5 years or based on polyp number.    CANCER SCREENING: Below are the NCCN Practice guidelines for women and men.  However, because the breast cancer risks for women and prostate cancer risks for men may be similar, it is appropriate to consider these high risk management recommendations.  Breast Management Options We reviewed the NCCN practice guidelines (v1.2019) for breast management for women at an increased risk of breast cancer because of CHEK2 mutations:   1. Breast awareness (which may include periodic, consistent breast self exam) starting at age 60.  2. Breast screening:  . Starting at age 10, annual mammogram and breast MRI screening, or starting 10 years younger than the earliest age of onset.   Colon Cancer Management:  Men and women with a deleterious CHEK2  mutation may have up to a 10% lifetime risk for colon cancer. The following is recommended for individuals with a CHEK2 mutation:  Personal history of colon cancer  Follow instructions provided by your physician based on your personal history.  Do not have a personal history of colon cancer but have a parent/sibling/child with colon cancer: Colonoscopy every 5 years starting at age 57 or 51 years younger than the earliest age of onset, whichever is younger.  Do not have a personal history of colon cancer but do not have a parent/sibling/child with colon cancer: Colonoscopy every 5 years starting at age 55.   FAMILY MEMBERS: It is important that all of Ms. Cupo's relatives (both men and women) know of the presence of this gene mutation.  Women need to know that they may be at increased risk for breast and colon cancers.  Men are at slightly increased risk for breast, prostate and colon cancers.  Genetic testing can sort out who in your family is at risk and who is not.  We would be happy to help meet with and coordinate genetic testing for any relative that is interested.  Ms. Granquist children are at 50% risk to have inherited the mutation found in her. Ms. Brodowski children, however, are relatively young and this will not be of any consequence to  them for several years. We do not test children because there is no risk to them until they are adults.  It should also be kept in mind that for children, we are bound to know a great deal more about breast cancer and its prevention in several years' time. We recommend that Ms. Hijazi'S children have genetic counseling and testing by the time that they are in their early 28s.    Ms. Riendeau sister has already undergone genetic testing and was negative for the CHEK2 pathogenic variant.  However, extended relatives are at risk for having this same mutation.  We recommend trying to contact the maternal cousins to let them know of this risk.  [facility],  the genetic testing laboratory, will offer free genetic testing for all relatives for 90 days after Ms. Dobbin's test date.     Our knowledge of cancer risks related to CHEK2 mutations will continue to evolve. We recommended that Ms. Shasteen follow up with the genetics clinic annually so we can provide her with the most current information about CHEK2 and cancer risk, as well as with any changes to her family history (new cancer diagnoses, genetic test results).  Our contact number was provided. Ms. Strieter questions were answered to her satisfaction, and she knows she is welcome to call us at anytime with additional questions or concerns.   Roma Kayser, MS, [facility] Certified Genetic Counselor [email or web address]

## 2017-03-23 NOTE — Telephone Encounter (Signed)
Revealed that she was found to have the same CHEK2 pathogenic variant that her mother had.  No other hereditary findings.  Discussed CHEK2 variants, risk for breast cancer is between 23-48%, with an average of 35%.  Increased risk for prostate and colon cancer.  Cancer screening for colon cancer should commence at age 50 and proceed every 5 years based on family history, polyp count and genetic testing.  Sent patient a copy of test results and fact sheets.  Patient will call if she wants to be seen.

## 2017-03-24 ENCOUNTER — Encounter: Payer: Self-pay | Admitting: Family Medicine

## 2017-04-12 ENCOUNTER — Telehealth: Payer: Self-pay | Admitting: Genetic Counselor

## 2017-04-12 NOTE — Telephone Encounter (Signed)
Patient called in last week to schedule a follow up appt with Genetics - did not have time patient requested to a message was sent to a Dietitian. Faythe Ghee was given to scheduled patient as a specific time requested . Left message for patient to call back to schedule follow up .

## 2017-06-30 ENCOUNTER — Other Ambulatory Visit: Payer: Self-pay | Admitting: Obstetrics and Gynecology

## 2017-06-30 DIAGNOSIS — Z1231 Encounter for screening mammogram for malignant neoplasm of breast: Secondary | ICD-10-CM

## 2017-07-19 ENCOUNTER — Ambulatory Visit
Admission: RE | Admit: 2017-07-19 | Discharge: 2017-07-19 | Disposition: A | Discharge status: Home / Self Care | Payer: BC Managed Care – PPO | Source: Ambulatory Visit | Attending: Obstetrics and Gynecology | Admitting: Obstetrics and Gynecology

## 2017-07-19 DIAGNOSIS — Z1231 Encounter for screening mammogram for malignant neoplasm of breast: Secondary | ICD-10-CM

## 2017-07-20 ENCOUNTER — Other Ambulatory Visit: Payer: Self-pay | Admitting: Obstetrics and Gynecology

## 2017-07-20 DIAGNOSIS — R928 Other abnormal and inconclusive findings on diagnostic imaging of breast: Secondary | ICD-10-CM

## 2017-07-26 ENCOUNTER — Ambulatory Visit
Admission: RE | Admit: 2017-07-26 | Discharge: 2017-07-26 | Disposition: A | Discharge status: Home / Self Care | Payer: BC Managed Care – PPO | Source: Ambulatory Visit | Attending: Obstetrics and Gynecology | Admitting: Obstetrics and Gynecology

## 2017-07-26 DIAGNOSIS — R928 Other abnormal and inconclusive findings on diagnostic imaging of breast: Secondary | ICD-10-CM

## 2018-01-11 ENCOUNTER — Telehealth: Payer: Self-pay | Admitting: Obstetrics and Gynecology

## 2018-01-11 NOTE — Telephone Encounter (Signed)
Patient believes she has a UTI. No openings that I can schedule her into. Needs as early in the morning or as late in the afternoon as possible.

## 2018-01-11 NOTE — Telephone Encounter (Signed)
Spoke with patient. UTI sx for one week. Pain worse at the end of her urine stream.  She denies fevers, flank pain or back pain.  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 Reviewed schedule with patient.  Will review with Dr. Talbert Nan and return call. Pt agreeable.

## 2018-01-11 NOTE — Telephone Encounter (Signed)
Reviewed with Dr. Talbert Nan.  Pt agreeable to appointment tomorrow at 0745, arrive at 0730 for check in.  Pt very grateful for appointment.  Encounter closed.

## 2018-01-12 ENCOUNTER — Ambulatory Visit: Payer: BC Managed Care – PPO | Admitting: Obstetrics and Gynecology

## 2018-01-12 ENCOUNTER — Encounter: Payer: Self-pay | Admitting: Obstetrics and Gynecology

## 2018-01-12 VITALS — BP 128/80 | HR 68 | Wt 146.0 lb

## 2018-01-12 DIAGNOSIS — R351 Nocturia: Secondary | ICD-10-CM

## 2018-01-12 DIAGNOSIS — R3915 Urgency of urination: Secondary | ICD-10-CM | POA: Diagnosis not present

## 2018-01-12 DIAGNOSIS — R350 Frequency of micturition: Secondary | ICD-10-CM

## 2018-01-12 LAB — POCT URINALYSIS DIPSTICK
BILIRUBIN UA: NEGATIVE
GLUCOSE UA: NEGATIVE
LEUKOCYTES UA: NEGATIVE
Nitrite, UA: NEGATIVE
PH UA: 6 (ref 5.0–8.0)
Protein, UA: NEGATIVE
RBC UA: NEGATIVE
UROBILINOGEN UA: 0.2 U/dL

## 2018-01-12 MED ORDER — SULFAMETHOXAZOLE-TRIMETHOPRIM 800-160 MG PO TABS: 1.0000 | ORAL_TABLET | Freq: Two times a day (BID) | ORAL | 0 refills | Status: DC

## 2018-01-12 MED ORDER — PHENAZOPYRIDINE HCL 200 MG PO TABS: 200.0000 mg | ORAL_TABLET | Freq: Three times a day (TID) | ORAL | 0 refills | Status: DC | PRN

## 2018-01-12 NOTE — Patient Instructions (Signed)

## 2018-01-12 NOTE — Progress Notes (Signed)
GYNECOLOGY  VISIT   HPI: 52 y.o.   Married  Caucasian  female   G2P2002 with No LMP recorded.   here for urinary frequency, urgency and nocturia.  Her symptoms started a week ago and come and go. The urgency is more bothersome. No dysuria, but feels like she isn't empty but she is. Up to void 2 x a night to void. She is voiding small to large amounts. No increase in caffeine intake. No flank pain, no abdominal pain, no fevers.    GYNECOLOGIC HISTORY: No LMP recorded. Contraception:IUD paragard Menopausal hormone therapy: n/a        OB History    Gravida  2   Para  2   Term  2   Preterm      AB      Living  2     SAB      TAB      Ectopic      Multiple      Live Births                 Patient Active Problem List   Diagnosis Date Noted  . Genetic testing 03/23/2017  . At high risk for breast cancer 12/03/2016  . Family history of breast cancer in first degree relative 12/28/2013    Past Medical History:  Diagnosis Date  . 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 Migraine    in her 20's, improved after having children    Past Surgical History:  Procedure Laterality Date  . BREAST BIOPSY Left 01/02/2013   calcifications and FBC  . INTRAUTERINE DEVICE INSERTION     inserted 7/09  . TONSILLECTOMY AND ADENOIDECTOMY      Current Outpatient Medications  Medication Sig Dispense Refill  . aspirin-acetaminophen-caffeine (EXCEDRIN MIGRAINE) 250-250-65 MG per tablet Take 1 tablet by mouth every 6 (six) hours as needed.    . Calcium-Vitamin D-Vitamin K 500-100-40 MG-UNT-MCG CHEW Chew by mouth daily.    . citalopram (CELEXA) 40 MG tablet Take 1 tablet (40 mg total) by mouth daily. 90 tablet 3  . ibuprofen (ADVIL,MOTRIN) 200 MG tablet Take 200 mg by mouth every 6 (six) hours as needed.    . Multiple Vitamin (MULTI-VITAMIN) tablet Take 1 tablet by mouth daily.    Marland Kitchen PARAGARD INTRAUTERINE COPPER IU by Intrauterine route.     No current  facility-administered medications for this visit.      ALLERGIES: Patient has no known allergies.  Family History  Problem Relation Age of Onset  . Hypertension Mother   . Breast cancer Mother 35       treated with lumpectomy and radiation  . Thyroid disease Mother   . Hyperlipidemia Mother   . Hyperlipidemia Father   . Breast cancer Other   . Breast cancer Sister 49       2015 - myRisk (25 gene) negative. Treated with lumpectomy and radiation  . Heart disease Maternal Uncle        d.75  . Heart disease Paternal Uncle   . Leukemia Maternal Grandmother        d.70s -shortly after diagnosis  . Pancreatic cancer Paternal Grandmother 23       d.70s    Social History   Socioeconomic History  . Marital status: Married    Spouse name: Not on file  . Number of children: 2  . Years of education: Not on file  . Highest education level: Not on file  Occupational History  . Occupation: Publishing rights manager: [employer]  . Financial resource strain: Not on file  . Food insecurity:    Worry: Not on file    Inability: Not on file  . Transportation needs:    Medical: Not on file    Non-medical: Not on file  Tobacco Use  . Smoking status: Never Smoker  . Smokeless tobacco: Never Used  Substance and Sexual Activity  . Alcohol use: Yes    Alcohol/week: 3.0 standard drinks    Types: 3 Standard drinks or equivalent per week    Comment: 0-2 drinks/week  . Drug use: No  . Sexual activity: Yes    Partners: Male    Birth control/protection: IUD    Comment: paragard inserted 11/2007  Lifestyle  . Physical activity:    Days per week: Not on file    Minutes per session: Not on file  . Stress: Not on file  Relationships  . Social connections:    Talks on phone: Not on file    Gets together: Not on file    Attends religious service: Not on file    Active member of club or organization: Not on file    Attends meetings of clubs or organizations: Not  on file    Relationship status: Not on file  . Intimate partner violence:    Fear of current or ex partner: Not on file    Emotionally abused: Not on file    Physically abused: Not on file    Forced sexual activity: Not on file  Other Topics Concern  . 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   HENT: Negative.   Eyes: Negative.   Respiratory: Negative.   Cardiovascular: Negative.   Gastrointestinal: Negative.   Genitourinary: Positive for frequency and urgency.       Nocturia  Musculoskeletal: Negative.   Skin: Negative.   Neurological: Negative.   Endo/Heme/Allergies: Negative.   Psychiatric/Behavioral: Negative.   All other systems reviewed and are negative.   PHYSICAL EXAMINATION:    BP 128/80   Pulse 68   Wt 146 lb (66.2 kg)   BMI 23.74 kg/m     General appearance: alert, cooperative and appears stated age Abdomen: soft, non-tender; non distended, no masses,  no organomegaly CVA: not tender  Urine dip: negative  ASSESSMENT Urinary frequency, urgency, nocturia.    PLAN Send urine for ua, c&s Discussed treating now vs waiting for culture results. The patient desires treatment   An After Visit Summary was printed and given to the patient.

## 2018-01-13 LAB — URINALYSIS, MICROSCOPIC ONLY
BACTERIA UA: NONE SEEN
CASTS: NONE SEEN /LPF

## 2018-01-13 LAB — URINE CULTURE

## 2018-01-19 ENCOUNTER — Other Ambulatory Visit: Payer: Self-pay | Admitting: *Deleted

## 2018-01-19 DIAGNOSIS — Z1231 Encounter for screening mammogram for malignant neoplasm of breast: Secondary | ICD-10-CM

## 2018-01-25 NOTE — Progress Notes (Signed)
52 y.o. G85P2002 Married White or Caucasian Not Hispanic or Latino female here for annual exam.   Period Cycle (Days): (copper IUD) Period Duration (Days): (poor historian)  She reports irregular menses, ~q4-6 weeks, bleeds for 4 days. She saturates a super + tampon in up to one hour at times. Minimal cramps. She has been having random intermenstrual spotting for 6 months. Having tolerable vasomotor symptoms. She has a paragard IUD, due for removal now.  She has a h/o anxiety and depression. On Celexa, seeing a Social worker. Doing okay. Hoping to work things out with her Husband.  Sexually active, no pain or bleeding.   Family history of breast cancer in her mother in her early 51's and sister at 91 (sister BRCA negative). Patient with risk of 25% The patient had genetic testing, + for CHEK2 mutation. This increases her risk of breast cancer and colon cancer. She is already getting yearly mammograms and MRI's. She should also be getting q 5 year colonoscopies.  No LMP recorded.          Sexually active: Yes.    The current method of family planning is IUD.    Exercising: No.  The patient does not participate in regular exercise at present. Smoker:  no  Health Maintenance: Pap:  12/03/2016 normal History of abnormal Pap:  no MMG: 07/26/2017 BI-RADS CATEGORY  2: Benign. BMD:   n/a Colonoscopy: no, she is aware that she needs it, will plan it for the beginning of the year.  TDaP:  2007 Gardasil: n/a   reports that she has never smoked. She has never used smokeless tobacco. She reports that she drinks about 3.0 standard drinks of alcohol per week. She reports that she does not use drugs. Teaches choir. 2 daughters, 86 and 58. Oldest is at the [employer]. Betsy Coder is a Paramedic in [place].   Past Medical History:  Diagnosis Date  . 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 Anxiety and depression   . At high risk for breast cancer   . At risk for colon cancer   . Family  history of breast cancer   . Migraine    in her 20's, improved after having children    Past Surgical History:  Procedure Laterality Date  . BREAST BIOPSY Left 01/02/2013   calcifications and FBC  . INTRAUTERINE DEVICE INSERTION     inserted 7/09  . TONSILLECTOMY AND ADENOIDECTOMY      Current Outpatient Medications  Medication Sig Dispense Refill  . aspirin-acetaminophen-caffeine (EXCEDRIN MIGRAINE) 250-250-65 MG per tablet Take 1 tablet by mouth every 6 (six) hours as needed.    . Calcium-Vitamin D-Vitamin K 500-100-40 MG-UNT-MCG CHEW Chew by mouth daily.    . citalopram (CELEXA) 40 MG tablet Take 1 tablet (40 mg total) by mouth daily. 90 tablet 3  . ibuprofen (ADVIL,MOTRIN) 200 MG tablet Take 200 mg by mouth every 6 (six) hours as needed.    . Multiple Vitamin (MULTI-VITAMIN) tablet Take 1 tablet by mouth daily.    Marland Kitchen PARAGARD INTRAUTERINE COPPER IU by Intrauterine route.     No current facility-administered medications for this visit.     Family History  Problem Relation Age of Onset  . Hypertension Mother   . Breast cancer Mother 108       treated with lumpectomy and radiation  . Thyroid disease Mother   . Hyperlipidemia Mother   . Hyperlipidemia Father   . Breast cancer Other   . Breast  cancer Sister 49       2015 - myRisk (25 gene) negative. Treated with lumpectomy and radiation  . Heart disease Maternal Uncle        d.75  . Heart disease Paternal Uncle   . Leukemia Maternal Grandmother        d.70s -shortly after diagnosis  . Pancreatic cancer Paternal Grandmother 58       d.70s    Review of Systems  Constitutional: Negative.   HENT: Negative.   Eyes: Negative.   Respiratory: Negative.   Cardiovascular: Negative.   Gastrointestinal: Negative.   Endocrine: Negative.   Genitourinary:       Nocturia Loss of urine  Musculoskeletal: Negative.   Skin: Negative.   Allergic/Immunologic: Negative.   Neurological: Negative.   Hematological: Negative.    Psychiatric/Behavioral: Positive for dysphoric mood.  All other systems reviewed and are negative.   Exam:   BP 122/78   Pulse 72   Resp 14   Ht 5' 5.5" (1.664 m)   Wt 145 lb (65.8 kg)   BMI 23.76 kg/m   Weight change: @WEIGHTCHANGE @ Height:   Height: 5' 5.5" (166.4 cm)  Ht Readings from Last 3 Encounters:  01/27/18 5' 5.5" (1.664 m)  12/03/16 5' 5.75" (1.67 m)  03/18/15 5' 5.5" (1.664 m)    General appearance: alert, cooperative and appears stated age Head: Normocephalic, without obvious abnormality, atraumatic Neck: no adenopathy, supple, symmetrical, trachea midline and thyroid normal to inspection and palpation Lungs: clear to auscultation bilaterally Cardiovascular: regular rate and rhythm Breasts: normal appearance, no masses or tenderness Abdomen: soft, non-tender; non distended,  no masses,  no organomegaly Extremities: extremities normal, atraumatic, no cyanosis or edema Skin: Skin color, texture, turgor normal. No rashes or lesions Lymph nodes: Cervical, supraclavicular, and axillary nodes normal. No abnormal inguinal nodes palpated Neurologic: Grossly normal   Pelvic: External genitalia:  no lesions              Urethra:  normal appearing urethra with no masses, tenderness or lesions              Bartholins and Skenes: normal                 Vagina: normal appearing vagina with normal color and discharge, no lesions              Cervix: no cervical motion tenderness and IUD removed with ringed forceps. Cervical polyp removed with ringed forceps. Bleeding from polyp base treated with silver nitrate and monsels with good hemostasis.               Bimanual Exam:  Uterus:  normal size, contour, position, consistency, mobility, non-tender              Adnexa: no mass, fullness, tenderness               Rectovaginal: Confirms               Anus:  normal sphincter tone, no lesions  Chaperone was present for exam.  A:  Well Woman with normal exam  Irregular cycles,  mild vasomotor symptoms  Intermenstrual spotting for the last 6 months, cervical polyp on exam, removed with ringed forceps  Increased risk of breast and colon cancer  IUD due for removal  Depression, anxiety reasonably well controlled  P:   Screening labs with TSH  She will do the breast MRI in 2020   Mammogram is UTD  Colonoscopy referral placed  Discussed breast self exam  Discussed calcium and vit D intake  Pull IUD  Condoms for contraception  Calendar cycles, f/u in 3 months.   Continue Celexa

## 2018-01-27 ENCOUNTER — Ambulatory Visit: Payer: BC Managed Care – PPO | Admitting: Obstetrics and Gynecology

## 2018-01-27 ENCOUNTER — Other Ambulatory Visit: Payer: Self-pay

## 2018-01-27 ENCOUNTER — Encounter: Payer: Self-pay | Admitting: Obstetrics and Gynecology

## 2018-01-27 VITALS — BP 122/78 | HR 72 | Resp 14 | Ht 65.5 in | Wt 145.0 lb

## 2018-01-27 DIAGNOSIS — N926 Irregular menstruation, unspecified: Secondary | ICD-10-CM

## 2018-01-27 DIAGNOSIS — Z803 Family history of malignant neoplasm of breast: Secondary | ICD-10-CM | POA: Diagnosis not present

## 2018-01-27 DIAGNOSIS — Z9189 Other specified personal risk factors, not elsewhere classified: Secondary | ICD-10-CM | POA: Diagnosis not present

## 2018-01-27 DIAGNOSIS — Z30432 Encounter for removal of intrauterine contraceptive device: Secondary | ICD-10-CM | POA: Diagnosis not present

## 2018-01-27 DIAGNOSIS — N923 Ovulation bleeding: Secondary | ICD-10-CM | POA: Diagnosis not present

## 2018-01-27 DIAGNOSIS — Z1211 Encounter for screening for malignant neoplasm of colon: Secondary | ICD-10-CM

## 2018-01-27 DIAGNOSIS — N841 Polyp of cervix uteri: Secondary | ICD-10-CM | POA: Diagnosis not present

## 2018-01-27 DIAGNOSIS — Z01419 Encounter for gynecological examination (general) (routine) without abnormal findings: Secondary | ICD-10-CM

## 2018-01-27 DIAGNOSIS — Z0000 Encounter for general adult medical examination without abnormal findings: Secondary | ICD-10-CM

## 2018-01-27 MED ORDER — CITALOPRAM HYDROBROMIDE 40 MG PO TABS: 40.0000 mg | ORAL_TABLET | Freq: Every day | ORAL | 3 refills | Status: DC

## 2018-01-28 LAB — COMPREHENSIVE METABOLIC PANEL
ALK PHOS: 71 IU/L (ref 39–117)
ALT: 14 IU/L (ref 0–32)
AST: 18 IU/L (ref 0–40)
Albumin/Globulin Ratio: 1.8 (ref 1.2–2.2)
Albumin: 4.3 g/dL (ref 3.5–5.5)
BILIRUBIN TOTAL: 0.3 mg/dL (ref 0.0–1.2)
BUN/Creatinine Ratio: 14 (ref 9–23)
BUN: 12 mg/dL (ref 6–24)
CO2: 22 mmol/L (ref 20–29)
Calcium: 9.4 mg/dL (ref 8.7–10.2)
Chloride: 101 mmol/L (ref 96–106)
Creatinine, Ser: 0.86 mg/dL (ref 0.57–1.00)
GFR calc Af Amer: 91 mL/min/{1.73_m2} (ref >59)
GFR calc non Af Amer: 79 mL/min/{1.73_m2} (ref >59)
GLUCOSE: 59 mg/dL — AB (ref 65–99)
Globulin, Total: 2.4 g/dL (ref 1.5–4.5)
Potassium: 4.5 mmol/L (ref 3.5–5.2)
Sodium: 141 mmol/L (ref 134–144)
Total Protein: 6.7 g/dL (ref 6.0–8.5)

## 2018-01-28 LAB — CBC
Hematocrit: 41.9 % (ref 34.0–46.6)
Hemoglobin: 13.4 g/dL (ref 11.1–15.9)
MCH: 30.4 pg (ref 26.6–33.0)
MCHC: 32 g/dL (ref 31.5–35.7)
MCV: 95 fL (ref 79–97)
Platelets: 231 10*3/uL (ref 150–450)
RBC: 4.41 x10E6/uL (ref 3.77–5.28)
RDW: 13.4 % (ref 12.3–15.4)
WBC: 8 10*3/uL (ref 3.4–10.8)

## 2018-01-28 LAB — LIPID PANEL
CHOL/HDL RATIO: 3 ratio (ref 0.0–4.4)
Cholesterol, Total: 194 mg/dL (ref 100–199)
HDL: 64 mg/dL (ref >39)
LDL Calculated: 111 mg/dL — ABNORMAL HIGH (ref 0–99)
TRIGLYCERIDES: 97 mg/dL (ref 0–149)
VLDL Cholesterol Cal: 19 mg/dL (ref 5–40)

## 2018-01-28 LAB — TSH: TSH: 1.45 u[IU]/mL (ref 0.450–4.500)

## 2018-01-31 ENCOUNTER — Telehealth: Payer: Self-pay

## 2018-01-31 NOTE — Telephone Encounter (Signed)
Informed patient of results and recommendations. 

## 2018-01-31 NOTE — Telephone Encounter (Signed)
-----   Message from Salvadore Dom, MD sent at 01/30/2018  2:33 PM EDT ----- Please let the patient know that her glucose level was mildly low. Unless she was having symptoms (ie light headed, dizzy), I don't think she needs further evaluation. If she is having symptoms, she should see a primary MD. Her LDL was mildly elevated. The rest of her lipid panel and blood work was normal. I would just repeat it next year. Pathology on the cervical polyp is pending.

## 2018-03-16 ENCOUNTER — Encounter: Payer: Self-pay | Admitting: Family Medicine

## 2018-04-26 NOTE — Progress Notes (Signed)
GYNECOLOGY  VISIT   HPI: 52 y.o.   Married White or Caucasian Not Hispanic or Latino  female   [phone] with Patient's last menstrual period was 04/13/2018 (exact date).   here for 3 month follow up on menstrual cycles. At the time of her annual exam in 9/19 she was having irregular cycles with intermenstrual spotting. She had a cervical polyp removed and her paragard removed. She had a normal CBC and TSH. She is here for f/u. Since her last visit her cycles have been ~ q month x 5 day, saturates an ultra tampon in up to 2 hours (for 1/2 a day). Cramps are better. No longer having any BTB. Cycles are a little lighter without the paragard.  She has been using condoms for contraception, but is interested in other contraception. She has an increased risk of breast cancer and doesn't want to use anything hormonal. Husband won't have a vasectomy.   GYNECOLOGIC HISTORY: Patient's last menstrual period was 04/13/2018 (exact date). Contraception: Condoms Menopausal hormone therapy: None       OB History    Gravida  2   Para  2   Term  2   Preterm      AB      Living  2     SAB      TAB      Ectopic      Multiple      Live Births                 Patient Active Problem List   Diagnosis Date Noted  . Family history of breast cancer   . At risk for colon cancer   . Genetic testing 03/23/2017  . At high risk for breast cancer 12/03/2016  . Family history of breast cancer in first degree relative 12/28/2013    Past Medical History:  Diagnosis Date  . 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 Anxiety and depression   . At high risk for breast cancer   . At risk for colon cancer   . Family history of breast cancer   . Migraine    in her 20's, improved after having children    Past Surgical History:  Procedure Laterality Date  . BREAST BIOPSY Left 01/02/2013   calcifications and FBC  . INTRAUTERINE DEVICE INSERTION     inserted 7/09  . TONSILLECTOMY AND  ADENOIDECTOMY      Current Outpatient Medications  Medication Sig Dispense Refill  . aspirin-acetaminophen-caffeine (EXCEDRIN MIGRAINE) 250-250-65 MG per tablet Take 1 tablet by mouth every 6 (six) hours as needed.    . Calcium-Vitamin D-Vitamin K 500-100-40 MG-UNT-MCG CHEW Chew by mouth daily.    . citalopram (CELEXA) 40 MG tablet Take 1 tablet (40 mg total) by mouth daily. 90 tablet 3  . ibuprofen (ADVIL,MOTRIN) 200 MG tablet Take 200 mg by mouth every 6 (six) hours as needed.    . Multiple Vitamin (MULTI-VITAMIN) tablet Take 1 tablet by mouth daily.     No current facility-administered medications for this visit.      ALLERGIES: Patient has no known allergies.  Family History  Problem Relation Age of Onset  . Hypertension Mother   . Breast cancer Mother 38       treated with lumpectomy and radiation  . Thyroid disease Mother   . Hyperlipidemia Mother   . Hyperlipidemia Father   . Breast cancer Other   . Breast cancer Sister 41  2015 - myRisk (25 gene) negative. Treated with lumpectomy and radiation  . Heart disease Maternal Uncle        d.75  . Heart disease Paternal Uncle   . Leukemia Maternal Grandmother        d.70s -shortly after diagnosis  . Pancreatic cancer Paternal Grandmother 74       d.70s    Social History   Socioeconomic History  . Marital status: Married    Spouse name: Not on file  . Number of children: 2  . Years of education: Not on file  . Highest education level: Not on file  Occupational History  . Occupation: Publishing rights manager: [employer]  . Financial resource strain: Not on file  . Food insecurity:    Worry: Not on file    Inability: Not on file  . Transportation needs:    Medical: Not on file    Non-medical: Not on file  Tobacco Use  . Smoking status: Never Smoker  . Smokeless tobacco: Never Used  Substance and Sexual Activity  . Alcohol use: Yes    Alcohol/week: 3.0 standard drinks    Types:  3 Standard drinks or equivalent per week    Comment: 0-2 drinks/week  . Drug use: No  . Sexual activity: Yes    Partners: Male    Birth control/protection: Condom  Lifestyle  . Physical activity:    Days per week: Not on file    Minutes per session: Not on file  . Stress: Not on file  Relationships  . Social connections:    Talks on phone: Not on file    Gets together: Not on file    Attends religious service: Not on file    Active member of club or organization: Not on file    Attends meetings of clubs or organizations: Not on file    Relationship status: Not on file  . Intimate partner violence:    Fear of current or ex partner: Not on file    Emotionally abused: Not on file    Physically abused: Not on file    Forced sexual activity: Not on file  Other Topics Concern  . 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   HENT: Negative.   Eyes: Negative.   Respiratory: Negative.   Cardiovascular: Negative.   Gastrointestinal: Negative.   Genitourinary: Negative.   Musculoskeletal: Negative.   Skin: Negative.   Neurological: Negative.   Endo/Heme/Allergies: Negative.   Psychiatric/Behavioral: Negative.     PHYSICAL EXAMINATION:    LMP 04/13/2018 (Exact Date)     General appearance: alert, cooperative and appears stated age   ASSESSMENT Intermenstrual spotting, resolved after removal of cervical polyp and paragard IUD Contraception Elevated risk of breast cancer    PLAN Discussed vasectomy, husband has declined Discussed options of the hormonal and non hormonal IUD's. She prefers non hormonal She will return for paragard IUD insertion, will continue to use condoms until then   An After Visit Summary was printed and given to the patient.

## 2018-04-28 ENCOUNTER — Ambulatory Visit: Payer: BC Managed Care – PPO | Admitting: Obstetrics and Gynecology

## 2018-04-28 ENCOUNTER — Encounter: Payer: Self-pay | Admitting: Obstetrics and Gynecology

## 2018-04-28 ENCOUNTER — Other Ambulatory Visit: Payer: Self-pay

## 2018-04-28 VITALS — BP 122/68 | HR 64 | Wt 152.8 lb

## 2018-04-28 DIAGNOSIS — Z3009 Encounter for other general counseling and advice on contraception: Secondary | ICD-10-CM

## 2018-04-28 DIAGNOSIS — Z8742 Personal history of other diseases of the female genital tract: Secondary | ICD-10-CM | POA: Diagnosis not present

## 2018-05-03 ENCOUNTER — Telehealth: Payer: Self-pay | Admitting: Obstetrics and Gynecology

## 2018-05-03 NOTE — Telephone Encounter (Signed)
Call placed to convey benefits for Paragard IUD.

## 2018-05-05 ENCOUNTER — Other Ambulatory Visit (INDEPENDENT_AMBULATORY_CARE_PROVIDER_SITE_OTHER): Payer: BC Managed Care – PPO

## 2018-05-05 DIAGNOSIS — Z23 Encounter for immunization: Secondary | ICD-10-CM | POA: Diagnosis not present

## 2018-05-06 NOTE — Telephone Encounter (Signed)
Call placed in reference to scheduling an appointment.

## 2018-05-19 NOTE — Telephone Encounter (Signed)
Patient would like to schedule IUD insertion.

## 2018-05-19 NOTE — Telephone Encounter (Signed)
Call placed to convey benefits for iud. °

## 2018-05-25 NOTE — Telephone Encounter (Signed)
Call placed to convey new benefits for IUD insertion.

## 2018-06-06 NOTE — Progress Notes (Signed)
GYNECOLOGY  VISIT   HPI: 53 y.o.   Married White or Caucasian Not Hispanic or Latino  female   [phone] with Patient's last menstrual period was 06/02/2018 (exact date).   here for Paragard insertion.   Her 58 year old daughter Wynelle Link overdosed on anaprox last month, she is doing okay, was admitted to the psychiatric hospital. She is not doing well in school, not wanting to be parented. Very stressful. Still with stress in her marriage .  She is depressed, has gained 30 lbs in the last 2 years. She has a good counselor, doesn't see her often. Can't get herself to the gym. She is on Celexa 40 mg, doesn't want to add more medication or see a Psychiatrist.   GYNECOLOGIC HISTORY: Patient's last menstrual period was 06/02/2018 (exact date). Contraception: Condoms Menopausal hormone therapy: None        OB History    Gravida  2   Para  2   Term  2   Preterm      AB      Living  2     SAB      TAB      Ectopic      Multiple      Live Births                 Patient Active Problem List   Diagnosis Date Noted  . Family history of breast cancer   . At risk for colon cancer   . Genetic testing 03/23/2017  . At high risk for breast cancer 12/03/2016  . Family history of breast cancer in first degree relative 12/28/2013    Past Medical History:  Diagnosis Date  . 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 Anxiety and depression   . At high risk for breast cancer   . At risk for colon cancer   . Family history of breast cancer   . Migraine    in her 20's, improved after having children    Past Surgical History:  Procedure Laterality Date  . BREAST BIOPSY Left 01/02/2013   calcifications and FBC  . INTRAUTERINE DEVICE INSERTION     inserted 7/09  . TONSILLECTOMY AND ADENOIDECTOMY      Current Outpatient Medications  Medication Sig Dispense Refill  . aspirin-acetaminophen-caffeine (EXCEDRIN MIGRAINE) 250-250-65 MG per tablet Take 1 tablet by mouth  every 6 (six) hours as needed.    . Calcium-Vitamin D-Vitamin K 500-100-40 MG-UNT-MCG CHEW Chew by mouth daily.    . citalopram (CELEXA) 40 MG tablet Take 1 tablet (40 mg total) by mouth daily. 90 tablet 3  . ibuprofen (ADVIL,MOTRIN) 200 MG tablet Take 200 mg by mouth every 6 (six) hours as needed.    . Multiple Vitamin (MULTI-VITAMIN) tablet Take 1 tablet by mouth daily.     No current facility-administered medications for this visit.      ALLERGIES: Patient has no known allergies.  Family History  Problem Relation Age of Onset  . Hypertension Mother   . Breast cancer Mother 59       treated with lumpectomy and radiation  . Thyroid disease Mother   . Hyperlipidemia Mother   . Hyperlipidemia Father   . Breast cancer Other   . Breast cancer Sister 49       2015 - myRisk (25 gene) negative. Treated with lumpectomy and radiation  . Heart disease Maternal Uncle        d.75  . Heart  disease Paternal Uncle   . Leukemia Maternal Grandmother        d.70s -shortly after diagnosis  . Pancreatic cancer Paternal Grandmother 36       d.70s    Social History   Socioeconomic History  . Marital status: Married    Spouse name: Not on file  . Number of children: 2  . Years of education: Not on file  . Highest education level: Not on file  Occupational History  . Occupation: Publishing rights manager: [employer]  . Financial resource strain: Not on file  . Food insecurity:    Worry: Not on file    Inability: Not on file  . Transportation needs:    Medical: Not on file    Non-medical: Not on file  Tobacco Use  . Smoking status: Never Smoker  . Smokeless tobacco: Never Used  Substance and Sexual Activity  . Alcohol use: Yes    Alcohol/week: 3.0 standard drinks    Types: 3 Standard drinks or equivalent per week    Comment: 0-2 drinks/week  . Drug use: No  . Sexual activity: Yes    Partners: Male    Birth control/protection: Condom  Lifestyle  .  Physical activity:    Days per week: Not on file    Minutes per session: Not on file  . Stress: Not on file  Relationships  . Social connections:    Talks on phone: Not on file    Gets together: Not on file    Attends religious service: Not on file    Active member of club or organization: Not on file    Attends meetings of clubs or organizations: Not on file    Relationship status: Not on file  . Intimate partner violence:    Fear of current or ex partner: Not on file    Emotionally abused: Not on file    Physically abused: Not on file    Forced sexual activity: Not on file  Other Topics Concern  . 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        Skin tag on face  Eyes: Negative.   Respiratory: Negative.   Cardiovascular: Negative.   Gastrointestinal: Negative.   Genitourinary: Negative.   Musculoskeletal: Negative.   Skin: Negative.   Neurological: Positive for headaches.  Endo/Heme/Allergies: Negative.   Psychiatric/Behavioral: Positive for depression.    PHYSICAL EXAMINATION:    BP 128/82 (BP Location: Right Arm, Patient Position: Sitting, Cuff Size: Normal)   Pulse 76   Wt 155 lb (70.3 kg)   LMP 06/02/2018 (Exact Date)   BMI 25.40 kg/m     General appearance: alert, cooperative and appears stated age Skin: she has a lesion on her forehead, trying to get into dermatology to have it removed.   Pelvic: External genitalia:  no lesions              Urethra:  normal appearing urethra with no masses, tenderness or lesions              Bartholins and Skenes: normal                 Vagina: normal appearing vagina with normal color and discharge, no lesions              Cervix: no lesions  The risks of the Paragard IUD were reviewed with the patient, including infection, abnormal bleeding and uterine perfortion. Consent was signed.  A  speculum was placed in the vagina, the cervix was cleansed with betadine. A tenaculum was placed on the cervix, the uterus sounded to 7 cm. The cervix was dilated to a 5 hagar dilator (had to start with the mini-dilators)  The Paragard IUD was inserted without difficulty. The string were cut to 3-4 cm. The tenaculum was removed. Slight oozing from the tenaculum site was stopped with pressure.   The patient tolerated the procedure well.    Chaperone was present for exam.  ASSESSMENT Contraception Depression, situational. On medication, has a therapist    PLAN Paragard IUD placed F/U in one month Declines adding medication for depression or seeing Psychiatry. Will call if she would like to do something else.   An After Visit Summary was printed and given to the patient.  Over 15 minutes face to face time of which over 50% was spent in counseling about depression.

## 2018-06-09 ENCOUNTER — Ambulatory Visit (INDEPENDENT_AMBULATORY_CARE_PROVIDER_SITE_OTHER): Payer: BC Managed Care – PPO | Admitting: Obstetrics and Gynecology

## 2018-06-09 ENCOUNTER — Other Ambulatory Visit: Payer: Self-pay

## 2018-06-09 ENCOUNTER — Encounter: Payer: Self-pay | Admitting: Obstetrics and Gynecology

## 2018-06-09 VITALS — BP 128/82 | HR 76 | Wt 155.0 lb

## 2018-06-09 DIAGNOSIS — Z01812 Encounter for preprocedural laboratory examination: Secondary | ICD-10-CM

## 2018-06-09 DIAGNOSIS — F329 Major depressive disorder, single episode, unspecified: Secondary | ICD-10-CM | POA: Diagnosis not present

## 2018-06-09 DIAGNOSIS — Z30430 Encounter for insertion of intrauterine contraceptive device: Secondary | ICD-10-CM

## 2018-06-09 DIAGNOSIS — F32A Depression, unspecified: Secondary | ICD-10-CM

## 2018-06-09 LAB — POCT URINE PREGNANCY: PREG TEST UR: NEGATIVE

## 2018-06-09 NOTE — Patient Instructions (Signed)

## 2018-07-06 NOTE — Progress Notes (Signed)
GYNECOLOGY  VISIT   HPI: 53 y.o.   Married White or Caucasian Not Hispanic or Latino  female   [phone] with No LMP recorded.   here for 1 month IUD recheck. She has a paragard IUD. LMP was 06/02/18    GYNECOLOGIC HISTORY: No LMP recorded. Contraception IUD Menopausal hormone therapy: none        OB History    Gravida  2   Para  2   Term  2   Preterm      AB      Living  2     SAB      TAB      Ectopic      Multiple      Live Births                 Patient Active Problem List   Diagnosis Date Noted  . Family history of breast cancer   . At risk for colon cancer   . Genetic testing 03/23/2017  . At high risk for breast cancer 12/03/2016  . Family history of breast cancer in first degree relative 12/28/2013    Past Medical History:  Diagnosis Date  . 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 Anxiety and depression   . At high risk for breast cancer   . At risk for colon cancer   . Family history of breast cancer   . Migraine    in her 20's, improved after having children    Past Surgical History:  Procedure Laterality Date  . BREAST BIOPSY Left 01/02/2013   calcifications and FBC  . INTRAUTERINE DEVICE INSERTION     inserted 7/09  . TONSILLECTOMY AND ADENOIDECTOMY      Current Outpatient Medications  Medication Sig Dispense Refill  . aspirin-acetaminophen-caffeine (EXCEDRIN MIGRAINE) 250-250-65 MG per tablet Take 1 tablet by mouth every 6 (six) hours as needed.    . Calcium-Vitamin D-Vitamin K 500-100-40 MG-UNT-MCG CHEW Chew by mouth daily.    . citalopram (CELEXA) 40 MG tablet Take 1 tablet (40 mg total) by mouth daily. 90 tablet 3  . ibuprofen (ADVIL,MOTRIN) 200 MG tablet Take 200 mg by mouth every 6 (six) hours as needed.    . Multiple Vitamin (MULTI-VITAMIN) tablet Take 1 tablet by mouth daily.     No current facility-administered medications for this visit.      ALLERGIES: Patient has no known allergies.  Family History   Problem Relation Age of Onset  . Hypertension Mother   . Breast cancer Mother 104       treated with lumpectomy and radiation  . Thyroid disease Mother   . Hyperlipidemia Mother   . Hyperlipidemia Father   . Breast cancer Other   . Breast cancer Sister 49       2015 - myRisk (25 gene) negative. Treated with lumpectomy and radiation  . Heart disease Maternal Uncle        d.75  . Heart disease Paternal Uncle   . Leukemia Maternal Grandmother        d.70s -shortly after diagnosis  . Pancreatic cancer Paternal Grandmother 56       d.70s    Social History   Socioeconomic History  . Marital status: Married    Spouse name: Not on file  . Number of children: 2  . Years of education: Not on file  . Highest education level: Not on file  Occupational History  . Occupation: Art therapist  Employer: [employer]  Social Needs  . Financial resource strain: Not on file  . Food insecurity:    Worry: Not on file    Inability: Not on file  . Transportation needs:    Medical: Not on file    Non-medical: Not on file  Tobacco Use  . Smoking status: Never Smoker  . Smokeless tobacco: Never Used  Substance and Sexual Activity  . Alcohol use: Yes    Alcohol/week: 3.0 standard drinks    Types: 3 Standard drinks or equivalent per week    Comment: 0-2 drinks/week  . Drug use: No  . Sexual activity: Yes    Partners: Male    Birth control/protection: Condom  Lifestyle  . Physical activity:    Days per week: Not on file    Minutes per session: Not on file  . Stress: Not on file  Relationships  . Social connections:    Talks on phone: Not on file    Gets together: Not on file    Attends religious service: Not on file    Active member of club or organization: Not on file    Attends meetings of clubs or organizations: Not on file    Relationship status: Not on file  . Intimate partner violence:    Fear of current or ex partner: Not on file    Emotionally abused: Not on  file    Physically abused: Not on file    Forced sexual activity: Not on file  Other Topics Concern  . 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   PHYSICAL EXAMINATION:    There were no vitals taken for this visit.    General appearance: alert, cooperative and appears stated age  Pelvic: External genitalia:  no lesions              Urethra:  normal appearing urethra with no masses, tenderness or lesions              Bartholins and Skenes: normal                 Vagina: normal appearing vagina with normal color and discharge, no lesions              Cervix: no lesions and IUD string 4 cm              Bimanual Exam:  Uterus:  normal size, contour, position, consistency, mobility, non-tender              Adnexa: no mass, fullness, tenderness               Chaperone was present for exam.  ASSESSMENT IUD check, doing well    PLAN Routine f/u   An After Visit Summary was printed and given to the patient.

## 2018-07-07 ENCOUNTER — Ambulatory Visit: Payer: BC Managed Care – PPO | Admitting: Obstetrics and Gynecology

## 2018-07-07 ENCOUNTER — Encounter: Payer: Self-pay | Admitting: Obstetrics and Gynecology

## 2018-07-07 VITALS — BP 130/74 | HR 66 | Resp 16 | Ht 65.0 in | Wt 153.0 lb

## 2018-07-07 DIAGNOSIS — Z30431 Encounter for routine checking of intrauterine contraceptive device: Secondary | ICD-10-CM

## 2018-10-25 ENCOUNTER — Other Ambulatory Visit: Payer: Self-pay | Admitting: Obstetrics and Gynecology

## 2018-10-25 DIAGNOSIS — Z1231 Encounter for screening mammogram for malignant neoplasm of breast: Secondary | ICD-10-CM

## 2018-11-16 HISTORY — PX: BREAST BIOPSY: SHX20

## 2018-12-01 ENCOUNTER — Other Ambulatory Visit: Payer: Self-pay

## 2018-12-01 ENCOUNTER — Ambulatory Visit
Admission: RE | Admit: 2018-12-01 | Discharge: 2018-12-01 | Disposition: A | Discharge status: Home / Self Care | Payer: BC Managed Care – PPO | Source: Ambulatory Visit | Attending: Obstetrics and Gynecology | Admitting: Obstetrics and Gynecology

## 2018-12-01 DIAGNOSIS — Z1231 Encounter for screening mammogram for malignant neoplasm of breast: Secondary | ICD-10-CM

## 2018-12-05 ENCOUNTER — Other Ambulatory Visit: Payer: Self-pay | Admitting: Obstetrics and Gynecology

## 2018-12-05 DIAGNOSIS — R928 Other abnormal and inconclusive findings on diagnostic imaging of breast: Secondary | ICD-10-CM

## 2018-12-06 ENCOUNTER — Ambulatory Visit
Admission: RE | Admit: 2018-12-06 | Discharge: 2018-12-06 | Disposition: A | Discharge status: Home / Self Care | Payer: BC Managed Care – PPO | Source: Ambulatory Visit | Attending: Obstetrics and Gynecology | Admitting: Obstetrics and Gynecology

## 2018-12-06 ENCOUNTER — Other Ambulatory Visit: Payer: Self-pay | Admitting: Obstetrics and Gynecology

## 2018-12-06 ENCOUNTER — Other Ambulatory Visit: Payer: Self-pay

## 2018-12-06 DIAGNOSIS — R599 Enlarged lymph nodes, unspecified: Secondary | ICD-10-CM

## 2018-12-06 DIAGNOSIS — R928 Other abnormal and inconclusive findings on diagnostic imaging of breast: Secondary | ICD-10-CM

## 2018-12-07 ENCOUNTER — Ambulatory Visit
Admission: RE | Admit: 2018-12-07 | Discharge: 2018-12-07 | Disposition: A | Discharge status: Home / Self Care | Payer: BC Managed Care – PPO | Source: Ambulatory Visit | Attending: Obstetrics and Gynecology | Admitting: Obstetrics and Gynecology

## 2018-12-07 ENCOUNTER — Other Ambulatory Visit: Payer: Self-pay

## 2018-12-07 DIAGNOSIS — R599 Enlarged lymph nodes, unspecified: Secondary | ICD-10-CM

## 2018-12-15 ENCOUNTER — Telehealth: Payer: Self-pay | Admitting: Family Medicine

## 2018-12-15 NOTE — Telephone Encounter (Signed)
Waunita Schooner called and states wife was stung yesterday on the hand, it has swollen greatly today, no sob, stiffness. Wants to know if they should ice it or any suggestions.  I offered a virtual or in house visit for today.  They just really wanted to know how to care for it.  Wife is on Fexofenadine and didn't know if ok to take benadryl.  Please call [phone]

## 2018-12-15 NOTE — Telephone Encounter (Signed)
Patient informed. 

## 2018-12-15 NOTE — Telephone Encounter (Signed)
Advise pt that I sent her a MyChart message with bee sting care.  She can take benadryl along with Allegra, if needed (worsening of swelling, any throat/tongue swelling, hives)

## 2019-02-14 ENCOUNTER — Encounter: Payer: Self-pay | Admitting: *Deleted

## 2019-02-20 ENCOUNTER — Other Ambulatory Visit: Payer: Self-pay | Admitting: Obstetrics and Gynecology

## 2019-02-20 NOTE — Telephone Encounter (Signed)
Medication refill request: Celexa Last AEX:  01/27/2018 JJ Next AEX: 02/23/2019 Last MMG (if hormonal medication request):  Refill authorized: Pending #[phone] with no refills if appropriate. Please advise.

## 2019-02-22 ENCOUNTER — Other Ambulatory Visit: Payer: Self-pay

## 2019-02-22 NOTE — Progress Notes (Signed)
53 y.o. G53P2002 Married White or Caucasian Not Hispanic or Latino female here for annual exam.  Cycles are okay with the paragard (placed in 1/20). Cycles are q 2.5-4 weeks x 4-5 days. She can saturate an ultra tampon in 2 hours. Occasional intermenstrual spotting, will just be a spot one day.   No dyspareunia. Occasional night sweats, tolerable.    Depression is mostly well controlled, she still has some bad days (situational).     Patient's last menstrual period was 01/30/2019 (within days).          Sexually active: Yes.    The current method of family planning is IUD.    Exercising: Yes.    walking Smoker:  no  Health Maintenance: Pap:  12/03/2016 normal, negative HPV History of abnormal Pap:  no MMG: 12/07/2018 Fibroadenoma of right breast BMD:   n/a Colonoscopy: never TDaP:  2007 Gardasil: n/a   reports that she has never smoked. She has never used smokeless tobacco. She reports current alcohol use of about 3.0 standard drinks of alcohol per week. She reports that she does not use drugs. Teaches choir. 2 daughters, 15 and 28. Oldest is at the [employer]. 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  Past Medical History:  Diagnosis Date  . 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 Anxiety and depression   . At high risk for breast cancer   . At risk for colon cancer   . Family history of breast cancer   . Migraine    in her 20's, improved after having children    Past Surgical History:  Procedure Laterality Date  . BREAST BIOPSY Left 01/02/2013   calcifications and FBC  . INTRAUTERINE DEVICE INSERTION     inserted 7/09  . TONSILLECTOMY AND ADENOIDECTOMY      Current Outpatient Medications  Medication Sig Dispense Refill  . aspirin-acetaminophen-caffeine (EXCEDRIN MIGRAINE) 250-250-65 MG per tablet Take 1 tablet by mouth every 6 (six) hours as needed.    . Calcium-Vitamin D-Vitamin K 500-100-40 MG-UNT-MCG CHEW Chew by mouth daily.    . citalopram (CELEXA) 40  MG tablet TAKE 1 TABLET BY MOUTH DAILY. 90 tablet 0  . Glucosamine-Chondroitin-MSM 500-200-150 MG TABS Take by mouth daily.    Marland Kitchen ibuprofen (ADVIL,MOTRIN) 200 MG tablet Take 200 mg by mouth every 6 (six) hours as needed.    . Multiple Vitamin (MULTI-VITAMIN) tablet Take 1 tablet by mouth daily.     No current facility-administered medications for this visit.     Family History  Problem Relation Age of Onset  . Hypertension Mother   . Breast cancer Mother 39       treated with lumpectomy and radiation  . Thyroid disease Mother   . Hyperlipidemia Mother   . Hyperlipidemia Father   . Breast cancer Other   . Breast cancer Sister 49       2015 - myRisk (25 gene) negative. Treated with lumpectomy and radiation  . Heart disease Maternal Uncle        d.75  . Heart disease Paternal Uncle   . Leukemia Maternal Grandmother        d.70s -shortly after diagnosis  . Pancreatic cancer Paternal Grandmother 42       d.70s    Review of Systems  Constitutional: Negative.   HENT: Negative.   Eyes: Negative.   Respiratory: Negative.   Cardiovascular: Negative.   Endocrine: Negative.   Genitourinary: Negative.   Musculoskeletal: Negative.  Skin: Negative.   Allergic/Immunologic: Negative.   Neurological: Negative.   Psychiatric/Behavioral: Negative.     Exam:   BP 102/66 (BP Location: Right Arm, Patient Position: Sitting, Cuff Size: Normal)   Pulse 76   Temp 98.6 F (37 C) (Oral)   Resp 12   Ht 5' 5.5" (1.664 m)   Wt 152 lb (68.9 kg)   LMP 01/30/2019 (Within Days)   BMI 24.91 kg/m   Weight change: @WEIGHTCHANGE @ Height:   Height: 5' 5.5" (166.4 cm)  Ht Readings from Last 3 Encounters:  02/23/19 5' 5.5" (1.664 m)  07/07/18 5\' 5"  (1.651 m)  01/27/18 5' 5.5" (1.664 m)    General appearance: alert, cooperative and appears stated age Head: Normocephalic, without obvious abnormality, atraumatic Neck: no adenopathy, supple, symmetrical, trachea midline and thyroid normal to  inspection and palpation Lungs: clear to auscultation bilaterally Cardiovascular: regular rate and rhythm Breasts: normal appearance, no masses or tenderness Abdomen: soft, non-tender; non distended,  no masses,  no organomegaly Extremities: extremities normal, atraumatic, no cyanosis or edema Skin: Skin color, texture, turgor normal. No rashes or lesions Lymph nodes: Cervical, supraclavicular, and axillary nodes normal. No abnormal inguinal nodes palpated Neurologic: Grossly normal   Pelvic: External genitalia:  no lesions              Urethra:  normal appearing urethra with no masses, tenderness or lesions              Bartholins and Skenes: normal                 Vagina: normal appearing vagina with normal color and discharge, no lesions              Cervix: no lesions, IUD string 3-4 cm               Bimanual Exam:  Uterus:  normal size, contour, position, consistency, mobility, non-tender              Adnexa: no mass, fullness, tenderness               Rectovaginal: Confirms               Anus:  normal sphincter tone, no lesions  Chaperone was present for exam.  A:  Well Woman with normal exam  Paragard IUD check  Irregular uterine bleeding  Elevated risk of breast cancer  Elevated risk of colon cancer  P:   No pap this year  Screening labs, TSH  Calendar cycles, send for review  Discussed breast self exam  Discussed calcium and vit D intake  TDAP today  Referral for colonoscopy placed   Mammogram UTD, will order MRI  Send to high risk breast clinic.

## 2019-02-23 ENCOUNTER — Telehealth: Payer: Self-pay | Admitting: *Deleted

## 2019-02-23 ENCOUNTER — Ambulatory Visit (INDEPENDENT_AMBULATORY_CARE_PROVIDER_SITE_OTHER): Payer: BC Managed Care – PPO | Admitting: Obstetrics and Gynecology

## 2019-02-23 ENCOUNTER — Encounter: Payer: Self-pay | Admitting: Obstetrics and Gynecology

## 2019-02-23 VITALS — BP 102/66 | HR 76 | Temp 98.6°F | Resp 12 | Ht 65.5 in | Wt 152.0 lb

## 2019-02-23 DIAGNOSIS — Z9189 Other specified personal risk factors, not elsewhere classified: Secondary | ICD-10-CM

## 2019-02-23 DIAGNOSIS — Z23 Encounter for immunization: Secondary | ICD-10-CM

## 2019-02-23 DIAGNOSIS — Z30431 Encounter for routine checking of intrauterine contraceptive device: Secondary | ICD-10-CM | POA: Diagnosis not present

## 2019-02-23 DIAGNOSIS — Z1211 Encounter for screening for malignant neoplasm of colon: Secondary | ICD-10-CM

## 2019-02-23 DIAGNOSIS — F32A Depression, unspecified: Secondary | ICD-10-CM

## 2019-02-23 DIAGNOSIS — Z803 Family history of malignant neoplasm of breast: Secondary | ICD-10-CM

## 2019-02-23 DIAGNOSIS — Z1231 Encounter for screening mammogram for malignant neoplasm of breast: Secondary | ICD-10-CM

## 2019-02-23 DIAGNOSIS — Z01419 Encounter for gynecological examination (general) (routine) without abnormal findings: Secondary | ICD-10-CM | POA: Diagnosis not present

## 2019-02-23 DIAGNOSIS — N926 Irregular menstruation, unspecified: Secondary | ICD-10-CM

## 2019-02-23 DIAGNOSIS — Z0000 Encounter for general adult medical examination without abnormal findings: Secondary | ICD-10-CM

## 2019-02-23 DIAGNOSIS — F329 Major depressive disorder, single episode, unspecified: Secondary | ICD-10-CM | POA: Diagnosis not present

## 2019-02-23 DIAGNOSIS — Z1239 Encounter for other screening for malignant neoplasm of breast: Secondary | ICD-10-CM

## 2019-02-23 NOTE — Telephone Encounter (Signed)
New order placed for breast MRI, previous order set to expire.  Order placed to [facility], Dr. Jana Hakim for high risk breast clinic.   Call placed to patient, left detailed message, name identified on voicemail, ok per dpr. Advised new order placed for breast MRI at [facility], they will contact you directly to schedule. Our office will precert once scheduled. Order placed for consult at [facility] for the high risk breast clinic, our office referral coordinator will f/u with appt details once scheduled. Return call to office if ant additional questions.  Routing to provider for final review. Patient is agreeable to disposition. Will close encounter.   Cc: Lerry Liner, Magdalene Patricia

## 2019-02-23 NOTE — Patient Instructions (Signed)
EXERCISE AND DIET:  We recommended that you start or continue a regular exercise program for good health. Regular exercise means any activity that makes your heart beat faster and makes you sweat.  We recommend exercising at least 30 minutes per day at least 3 days a week, preferably 4 or 5.  We also recommend a diet low in fat and sugar.  Inactivity, poor dietary choices and obesity can cause diabetes, heart attack, stroke, and kidney damage, among others.    ALCOHOL AND SMOKING:  Women should limit their alcohol intake to no more than 7 drinks/beers/glasses of wine (combined, not each!) per week. Moderation of alcohol intake to this level decreases your risk of breast cancer and liver damage. And of course, no recreational drugs are part of a healthy lifestyle.  And absolutely no smoking or even second hand smoke. Most people know smoking can cause heart and lung diseases, but did you know it also contributes to weakening of your bones? Aging of your skin?  Yellowing of your teeth and nails?  CALCIUM AND VITAMIN D:  Adequate intake of calcium and Vitamin D are recommended.  The recommendations for exact amounts of these supplements seem to change often, but generally speaking 1,00 mg of calcium (between diet and supplement) and 800 units of Vitamin D per day seems prudent. Certain women may benefit from higher intake of Vitamin D.  If you are among these women, your doctor will have told you during your visit.    PAP SMEARS:  Pap smears, to check for cervical cancer or precancers,  have traditionally been done yearly, although recent scientific advances have shown that most women can have pap smears less often.  However, every woman still should have a physical exam from her gynecologist every year. It will include a breast check, inspection of the vulva and vagina to check for abnormal growths or skin changes, a visual exam of the cervix, and then an exam to evaluate the size and shape of the uterus and  ovaries.  And after 52 years of age, a rectal exam is indicated to check for rectal cancers. We will also provide age appropriate advice regarding health maintenance, like when you should have certain vaccines, screening for sexually transmitted diseases, bone density testing, colonoscopy, mammograms, etc.   MAMMOGRAMS:  All women over 40 years old should have a yearly mammogram. Many facilities now offer a "3D" mammogram, which may cost around $50 extra out of pocket. If possible,  we recommend you accept the option to have the 3D mammogram performed.  It both reduces the number of women who will be called back for extra views which then turn out to be normal, and it is better than the routine mammogram at detecting truly abnormal areas.    COLON CANCER SCREENING: Now recommend starting at age 45. At this time colonoscopy is not covered for routine screening until 50. There are take home tests that can be done between 45-49.   COLONOSCOPY:  Colonoscopy to screen for colon cancer is recommended for all women at age 50.  We know, you hate the idea of the prep.  We agree, BUT, having colon cancer and not knowing it is worse!!  Colon cancer so often starts as a polyp that can be seen and removed at colonscopy, which can quite literally save your life!  And if your first colonoscopy is normal and you have no family history of colon cancer, most women don't have to have it again for   10 years.  Once every ten years, you can do something that may end up saving your life, right?  We will be happy to help you get it scheduled when you are ready.  Be sure to check your insurance coverage so you understand how much it will cost.  It may be covered as a preventative service at no cost, but you should check your particular policy.      Breast Self-Awareness Breast self-awareness means being familiar with how your breasts look and feel. It involves checking your breasts regularly and reporting any changes to your  health care provider. Practicing breast self-awareness is important. A change in your breasts can be a sign of a serious medical problem. Being familiar with how your breasts look and feel allows you to find any problems early, when treatment is more likely to be successful. All women should practice breast self-awareness, including women who have had breast implants. How to do a breast self-exam One way to learn what is normal for your breasts and whether your breasts are changing is to do a breast self-exam. To do a breast self-exam: Look for Changes  1. Remove all the clothing above your waist. 2. Stand in front of a mirror in a room with good lighting. 3. Put your hands on your hips. 4. Push your hands firmly downward. 5. Compare your breasts in the mirror. Look for differences between them (asymmetry), such as: ? Differences in shape. ? Differences in size. ? Puckers, dips, and bumps in one breast and not the other. 6. Look at each breast for changes in your skin, such as: ? Redness. ? Scaly areas. 7. Look for changes in your nipples, such as: ? Discharge. ? Bleeding. ? Dimpling. ? Redness. ? A change in position. Feel for Changes Carefully feel your breasts for lumps and changes. It is best to do this while lying on your back on the floor and again while sitting or standing in the shower or tub with soapy water on your skin. Feel each breast in the following way:  Place the arm on the side of the breast you are examining above your head.  Feel your breast with the other hand.  Start in the nipple area and make  inch (2 cm) overlapping circles to feel your breast. Use the pads of your three middle fingers to do this. Apply light pressure, then medium pressure, then firm pressure. The light pressure will allow you to feel the tissue closest to the skin. The medium pressure will allow you to feel the tissue that is a little deeper. The firm pressure will allow you to feel the tissue  close to the ribs.  Continue the overlapping circles, moving downward over the breast until you feel your ribs below your breast.  Move one finger-width toward the center of the body. Continue to use the  inch (2 cm) overlapping circles to feel your breast as you move slowly up toward your collarbone.  Continue the up and down exam using all three pressures until you reach your armpit.  Write Down What You Find  Write down what is normal for each breast and any changes that you find. Keep a written record with breast changes or normal findings for each breast. By writing this information down, you do not need to depend only on memory for size, tenderness, or location. Write down where you are in your menstrual cycle, if you are still menstruating. If you are having trouble noticing differences   in your breasts, do not get discouraged. With time you will become more familiar with the variations in your breasts and more comfortable with the exam. How often should I examine my breasts? Examine your breasts every month. If you are breastfeeding, the best time to examine your breasts is after a feeding or after using a breast pump. If you menstruate, the best time to examine your breasts is 5-7 days after your period is over. During your period, your breasts are lumpier, and it may be more difficult to notice changes. When should I see my health care provider? See your health care provider if you notice:  A change in shape or size of your breasts or nipples.  A change in the skin of your breast or nipples, such as a reddened or scaly area.  Unusual discharge from your nipples.  A lump or thick area that was not there before.  Pain in your breasts.  Anything that concerns you.  

## 2019-02-23 NOTE — Telephone Encounter (Signed)
-----   Message from Salvadore Dom, MD sent at 02/23/2019  8:36 AM EDT ----- She is now able to do the breast MRI, I see its ordered. Does it need to be scheduled? Can you also set her up at the high risk breast clinic for a consultation.  Thanks, Sharee Pimple

## 2019-02-24 LAB — LIPID PANEL
Chol/HDL Ratio: 3.5 ratio (ref 0.0–4.4)
Cholesterol, Total: 197 mg/dL (ref 100–199)
HDL: 57 mg/dL (ref >39)
LDL Chol Calc (NIH): 120 mg/dL — ABNORMAL HIGH (ref 0–99)
Triglycerides: 112 mg/dL (ref 0–149)
VLDL Cholesterol Cal: 20 mg/dL (ref 5–40)

## 2019-02-24 LAB — COMPREHENSIVE METABOLIC PANEL
ALT: 11 IU/L (ref 0–32)
AST: 17 IU/L (ref 0–40)
Albumin/Globulin Ratio: 1.7 (ref 1.2–2.2)
Albumin: 4.4 g/dL (ref 3.8–4.9)
Alkaline Phosphatase: 90 IU/L (ref 39–117)
BUN/Creatinine Ratio: 15 (ref 9–23)
BUN: 12 mg/dL (ref 6–24)
Bilirubin Total: 0.4 mg/dL (ref 0.0–1.2)
CO2: 24 mmol/L (ref 20–29)
Calcium: 9.2 mg/dL (ref 8.7–10.2)
Chloride: 103 mmol/L (ref 96–106)
Creatinine, Ser: 0.81 mg/dL (ref 0.57–1.00)
GFR calc Af Amer: 97 mL/min/{1.73_m2} (ref >59)
GFR calc non Af Amer: 84 mL/min/{1.73_m2} (ref >59)
Globulin, Total: 2.6 g/dL (ref 1.5–4.5)
Glucose: 84 mg/dL (ref 65–99)
Potassium: 4.2 mmol/L (ref 3.5–5.2)
Sodium: 139 mmol/L (ref 134–144)
Total Protein: 7 g/dL (ref 6.0–8.5)

## 2019-02-24 LAB — CBC
Hematocrit: 43.2 % (ref 34.0–46.6)
Hemoglobin: 14.1 g/dL (ref 11.1–15.9)
MCH: 31 pg (ref 26.6–33.0)
MCHC: 32.6 g/dL (ref 31.5–35.7)
MCV: 95 fL (ref 79–97)
Platelets: 220 10*3/uL (ref 150–450)
RBC: 4.55 x10E6/uL (ref 3.77–5.28)
RDW: 12.6 % (ref 11.7–15.4)
WBC: 8.5 10*3/uL (ref 3.4–10.8)

## 2019-02-24 LAB — TSH: TSH: 2.51 u[IU]/mL (ref 0.450–4.500)

## 2019-03-15 ENCOUNTER — Encounter: Payer: Self-pay | Admitting: Family Medicine

## 2019-03-15 ENCOUNTER — Other Ambulatory Visit: Payer: Self-pay

## 2019-03-15 ENCOUNTER — Ambulatory Visit: Payer: BC Managed Care – PPO | Admitting: Family Medicine

## 2019-03-15 VITALS — BP 110/70 | HR 64 | Temp 97.7°F | Ht 66.5 in | Wt 152.0 lb

## 2019-03-15 DIAGNOSIS — M79672 Pain in left foot: Secondary | ICD-10-CM

## 2019-03-15 DIAGNOSIS — T148XXA Other injury of unspecified body region, initial encounter: Secondary | ICD-10-CM | POA: Diagnosis not present

## 2019-03-15 DIAGNOSIS — L609 Nail disorder, unspecified: Secondary | ICD-10-CM | POA: Diagnosis not present

## 2019-03-15 DIAGNOSIS — M79671 Pain in right foot: Secondary | ICD-10-CM | POA: Diagnosis not present

## 2019-03-15 NOTE — Progress Notes (Signed)
Chief Complaint  Patient presents with  . Foot Pain    arch pain B/L. Also B/L 3rd toe near cuticle has some blisters and also has some random blood blister type places.    "my feet hurt" just about all the time" Pain is mostly in arches.  She wakes up with pain, hurts to walk at first, but doesn't resolve quickly.  Her feet can hurt just laying in bed.  She has been walking, in her running shoes.  New shoes in the last 4 months.  Same brand. Walks 5x/week x 4 miles.  More sore after walking. Getting new sneakers helped some. She is wearing tennis shoes most of the time. They had felt a little narrow at toes, so got sneakers that are wider in the toes, which helps, is more comfortable.  She has noticed some blood blisters on some toes, and worries about these. She has also noted some bumps near the DIP's of the 3rd toes bilaterally.  She reports that the nails have been growing in "lumpy" since noticing the lump at the base of the toe.  Once the one on the left seemed more like a blister, pressed on it and liquid came out, and then it felt a little better. She states the person who gave her pedicure told her to see a doctor about the abnormal toenail. She denies any discoloration below the nail, just the abnormal texture.  PMH, PSH, SH reviewed  Outpatient Encounter Medications as of 03/15/2019  Medication Sig  . aspirin-acetaminophen-caffeine (EXCEDRIN MIGRAINE) 250-250-65 MG per tablet Take 1 tablet by mouth every 6 (six) hours as needed.  . Calcium-Vitamin D-Vitamin K 500-100-40 MG-UNT-MCG CHEW Chew by mouth daily.  . Glucosamine-Chondroitin-MSM 500-200-150 MG TABS Take by mouth daily.  . Multiple Vitamin (MULTI-VITAMIN) tablet Take 1 tablet by mouth daily.  . [DISCONTINUED] citalopram (CELEXA) 40 MG tablet TAKE 1 TABLET BY MOUTH DAILY.  Marland Kitchen ibuprofen (ADVIL,MOTRIN) 200 MG tablet Take 200 mg by mouth every 6 (six) hours as needed.   No facility-administered encounter medications on file  as of 03/15/2019.    No Known Allergies  ROS: no fever, chills, URI symptoms, chest pain, shortness of breath, GI or other complaints except as noted in HPI. Some stress related to her younger daughter (poor grades, unmotivated, smoking marijuana), talked about her some, as she has a visit with me next week.  PHYSICAL EXAM:  BP 110/70   Pulse 64   Temp 97.7 F (36.5 C) (Other (Comment))   Ht 5' 6.5" (1.689 m)   Wt 152 lb (68.9 kg)   BMI 24.17 kg/m   Well-appearing, pleasant female, in good spirits HEENT: conjunctiva and sclera are clear, EOMI.  Wearing mask. Feet: 2+ pulses, no edema.  There is a blood blister at the medial aspect of the left great toe, and a more linear appearing blood blister on the bottom of the left 4th toe. No other blisters noted. She is nontender over the arches and PF origin at calcaneous. No significant nodules appreciable on today's exam at the 3rd toes, but the toenails are not smooth, depressed in the central portion.  Nails are polished. No thickening.   ASSESSMENT/PLAN:  Foot pain, bilateral - improved some since getting wider, newer shoes.  Rec increased arch support in casual tennis shoes. Discussed stretches. Will get fitted for sneakers at [facility]  Blood blister - reassured. These are in areas of irritation.  Discussed shoes, socks  Nail abnormality - 3rd toenails are abnormal bilateral,  with h/o nodules. denies discoloration (polished). May or may not grow out. If nodules recur, rec podiatrist   Agreed with recommendation for fitting through [facility] (schedule appt), to see if her current running shoes are appropriate. Discussed sizing, width.  If toe box is too narrow and shoe is a little short, could contribute to irritation of the top of the toes.  Recommended additional arch support to the relatively unsupported sneakers she wears casually.  May need to see podiatrist if ongoing discomfort in her feet.  F/u for CPE (scheduled for  April, but would like to come earlier if a cancellation, put on list). She is calling to get her colonoscopy arranged. She sees Dr. Talbert Nan regularly.

## 2019-04-08 ENCOUNTER — Other Ambulatory Visit: Payer: Self-pay

## 2019-04-08 ENCOUNTER — Ambulatory Visit
Admission: RE | Admit: 2019-04-08 | Discharge: 2019-04-08 | Disposition: A | Discharge status: Home / Self Care | Payer: BC Managed Care – PPO | Source: Ambulatory Visit | Attending: Obstetrics and Gynecology | Admitting: Obstetrics and Gynecology

## 2019-04-08 DIAGNOSIS — Z803 Family history of malignant neoplasm of breast: Secondary | ICD-10-CM

## 2019-04-08 DIAGNOSIS — Z9189 Other specified personal risk factors, not elsewhere classified: Secondary | ICD-10-CM

## 2019-04-08 DIAGNOSIS — Z1239 Encounter for other screening for malignant neoplasm of breast: Secondary | ICD-10-CM

## 2019-04-08 MED ORDER — GADOBUTROL 1 MMOL/ML IV SOLN
7.0000 mL | Freq: Once | INTRAVENOUS | Status: AC | PRN
  Administered 2019-04-08: 10:00:00 7 mL via INTRAVENOUS

## 2019-04-17 NOTE — Patient Instructions (Addendum)
HEALTH MAINTENANCE RECOMMENDATIONS:  It is recommended that you get at least 30 minutes of aerobic exercise at least 5 days/week (for weight loss, you may need as much as 60-90 minutes). This can be any activity that gets your heart rate up. This can be divided in 10-15 minute intervals if needed, but try and build up your endurance at least once a week.  Weight bearing exercise is also recommended twice weekly.  Eat a healthy diet with lots of vegetables, fruits and fiber.  "Colorful" foods have a lot of vitamins (ie green vegetables, tomatoes, red peppers, etc).  Limit sweet tea, regular sodas and alcoholic beverages, all of which has a lot of calories and sugar.  Up to 1 alcoholic drink daily may be beneficial for women (unless trying to lose weight, watch sugars).  Drink a lot of water.  Calcium recommendations are 1200-1500 mg daily (1500 mg for postmenopausal women or women without ovaries), and vitamin D 1000 IU daily.  This should be obtained from diet and/or supplements (vitamins), and calcium should not be taken all at once, but in divided doses.  Monthly self breast exams and yearly mammograms for women over the age of 17 is recommended.  Sunscreen of at least SPF 30 should be used on all sun-exposed parts of the skin when outside between the hours of 10 am and 4 pm (not just when at beach or pool, but even with exercise, golf, tennis, and yard work!)  Use a sunscreen that says "broad spectrum" so it covers both UVA and UVB rays, and make sure to reapply every 1-2 hours.  Remember to change the batteries in your smoke detectors when changing your clock times in the spring and fall. Carbon monoxide detectors are recommended for your home.  Use your seat belt every time you are in a car, and please drive safely and not be distracted with cell phones and texting while driving.  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  I recommend getting the new  shingles vaccine (Shingrix). You will need to check with your insurance to see if it is covered, and if covered, schedule a nurse visit at our office when convenient.  It is a series of 2 injections, spaced 2 months apart.    Kegel Exercises  Kegel exercises can help strengthen your pelvic floor muscles. The pelvic floor is a group of muscles that support your rectum, small intestine, and bladder. In females, pelvic floor muscles also help support the womb (uterus). These muscles help you control the flow of urine and stool. Kegel exercises are painless and simple, and they do not require any equipment. Your provider may suggest Kegel exercises to:  Improve bladder and bowel control.  Improve sexual response.  Improve weak pelvic floor muscles after surgery to remove the uterus (hysterectomy) or pregnancy (females).  Improve weak pelvic floor muscles after prostate gland removal or surgery (males). Kegel exercises involve squeezing your pelvic floor muscles, which are the same muscles you squeeze when you try to stop the flow of urine or keep from passing gas. The exercises can be done while sitting, standing, or lying down, but it is best to vary your position. Exercises How to do Kegel exercises: 1. Squeeze your pelvic floor muscles tight. You should feel a tight lift in your rectal area. If you are a female, you should also feel a tightness in your vaginal area. Keep your stomach, buttocks, and legs relaxed. 2. Hold the muscles  tight for up to 10 seconds. 3. Breathe normally. 4. Relax your muscles. 5. Repeat as told by your health care provider. Repeat this exercise daily as told by your health care provider. Continue to do this exercise for at least 4-6 weeks, or for as long as told by your health care provider. You may be referred to a physical therapist who can help you learn more about how to do Kegel exercises. Depending on your condition, your health care provider may recommend:   Varying how long you squeeze your muscles.  Doing several sets of exercises every day.  Doing exercises for several weeks.  Making Kegel exercises a part of your regular exercise routine. This information is not intended to replace advice given to you by your health care provider. Make sure you discuss any questions you have with your health care provider. Document Released: 04/20/2012 Document Revised: 12/22/2017 Document Reviewed: 12/22/2017 Elsevier Patient Education  2020 [employer].

## 2019-04-17 NOTE — Progress Notes (Signed)
Chief Complaint  Patient presents with  . Annual Exam    fasting annual exam no pap. Sees eye doctor regularly. No new concerns.     Annette Byrd is a 53 y.o. female who presents for a complete physical.    Seen recently with foot concerns. She went to [employer], got new [place] shoes, and her feet feel much better.  She sees Dr. Talbert Nan for [facility], last in 02/2019.  She had Paragard IUD placed 05/2018.  She still gets regular cycles, not particularly heavy.  Does have some perimenopausal symptoms, mild.  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 She is getting yearly breast MRI's, and recommended to have colonoscopy every 5 years due to higher risk for colon cancer with this mutations.  She has not yet had colonoscopy, but it scheduled to see GI later this month.  Depression:  She had been taking citalopram per her GYN.  She wasn't planning on stopping the citalopram, but she had run out in early October.  She forgot to talk to her GYN about it, didn't realize she had sent in a refill.  She had been off it for a few weeks. Didn't have significant effects from stopping it abruptly. She has felt fine since then; denies any recurrent symptoms of depression or anxiety (was originally more for depression).  She figures since she is doing well, she can stay off it.   Immunization History  Administered Date(s) Administered  . Influenza Inj Mdck Quad Pf 02/08/2019  . Influenza,inj,Quad PF,6+ Mos 05/05/2018, 02/08/2019  . Tdap 12/03/2005, 02/23/2019   Last Pap smear: 11/2016, normal, no high risk HPV Last mammogram: 11/2018.  She had biopsy of R breast, which revealed fibroadenoma. She had breast MRI done 04/08/2019 IMPRESSION: 1. Evaluation is limited due to moderate/marked background enhancement. Within these limitations, no MRI evidence for malignancy in either breast. 2. 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 RECOMMENDATION: Annual  screening with bilateral mammogram and breast MRI. Patient is due for her next screening mammogram in July 2021. Last colonoscopy: Never.  Has colonoscopy scheduled on 05/02/2019 Last DEXA: never Dentist: twice yearly Ophtho: yearly Exercise:  Walks almost daily, 4 miles, starting to run some also. No weight-bearing exercise  Past Medical History:  Diagnosis Date  . 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 Allergy   . Anxiety and depression   . 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 At risk for colon cancer    CHEK2 gene mutation  . Family history of breast cancer   . Migraine    in her 20's, improved after having children    Past Surgical History:  Procedure Laterality Date  . BREAST BIOPSY Left 01/02/2013   calcifications and FBC  . BREAST BIOPSY Right 11/2018   fibroadenoma  . INTRAUTERINE DEVICE INSERTION     inserted 7/09  . TONSILLECTOMY AND ADENOIDECTOMY      Social History   Socioeconomic History  . Marital status: Married    Spouse name: Not on file  . Number of children: 2  . Years of education: Not on file  . Highest education level: Not on file  Occupational History  . Occupation: Publishing rights manager: [employer]  . Financial resource strain: Not on file  . Food insecurity    Worry: Not on file    Inability: Not on file  .  Transportation needs    Medical: Not on file    Non-medical: Not on file  Tobacco Use  . Smoking status: Never Smoker  . Smokeless tobacco: Never Used  Substance and Sexual Activity  . Alcohol use: Yes    Alcohol/week: 3.0 standard drinks    Types: 3 Standard drinks or equivalent per week    Comment: 0-2 drinks/week  . Drug use: No  . Sexual activity: Yes    Partners: Male    Birth control/protection: I.U.D.  Lifestyle  . Physical activity    Days per week: Not on file    Minutes per session: Not on file  . Stress: Not on file  Relationships  . Social  Herbalist on phone: Not on file    Gets together: Not on file    Attends religious service: Not on file    Active member of club or organization: Not on file    Attends meetings of clubs or organizations: Not on file    Relationship status: Not on file  . Intimate partner violence    Fear of current or ex partner: Not on file    Emotionally abused: Not on file    Physically abused: Not on file    Forced sexual activity: Not on file  Other Topics Concern  . 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 Married. 2 outside dogs.   Lynn Ito is at [employer] is a senior    Family History  Problem Relation Age of Onset  . Hypertension Mother   . Breast cancer Mother 19       treated with lumpectomy and radiation  . Thyroid disease Mother   . Hyperlipidemia Mother   . Hyperlipidemia Father   . Hypertension Father   . Sleep apnea Father   . Breast cancer Other   . Breast cancer Sister 49       2015 - myRisk (25 gene) negative. Treated with lumpectomy and radiation  . Heart disease Maternal Uncle        d.75  . Heart disease Paternal Uncle   . Leukemia Maternal Grandmother        d.70s -shortly after diagnosis  . Pancreatic cancer Paternal Grandmother 53       d.70s  . Colon cancer Neg Hx   . Esophageal cancer Neg Hx   . Rectal cancer Neg Hx   . Stomach cancer Neg Hx     Outpatient Encounter Medications as of 04/19/2019  Medication Sig  . Calcium-Vitamin D-Vitamin K [id number] MG-UNT-MCG CHEW Chew by mouth daily.  . fexofenadine (ALLEGRA) 180 MG tablet Take 180 mg by mouth daily.  . Glucosamine-Chondroitin-MSM 500-200-150 MG TABS Take by mouth daily.  . Multiple Vitamin (MULTI-VITAMIN) tablet Take 1 tablet by mouth daily.  Marland Kitchen OVER THE COUNTER MEDICATION Fiber gummie, one gummie daily.  Marland Kitchen aspirin-acetaminophen-caffeine (EXCEDRIN MIGRAINE) 250-250-65 MG per tablet Take 1 tablet by mouth every 6 (six)  hours as needed.  Marland Kitchen ibuprofen (ADVIL,MOTRIN) 200 MG tablet Take 200 mg by mouth every 6 (six) hours as needed.  . [EXPIRED] Na Sulfate-K Sulfate-Mg Sulf 17.5-3.13-1.6 GM/177ML SOLN Take 1 kit by mouth once for 1 dose. Suprep as directed. No substitutions.   No facility-administered encounter medications on file as of 04/19/2019.    No Known Allergies  ROS:  The patient denies anorexia, fever, weight changes,  vision changes, decreased hearing, ear pain, sore  throat, breast concerns, chest pain, palpitations, dizziness, syncope, dyspnea on exertion, cough, swelling, nausea, vomiting, diarrhea, constipation, abdominal pain, melena, hematochezia, indigestion/heartburn, hematuria, incontinence, dysuria, irregular menstrual cycles, vaginal discharge, odor or itch, genital lesions, joint pains, numbness, tingling, weakness, tremor, suspicious skin lesions, anxiety, abnormal bleeding/bruising, or enlarged lymph nodes.  +occasional mild headaches, relieved by Excedrin migraine. Headaches are a little more severe about once a week, related to stress, or the day after alcohol.  Hasn't vomited with headaches in many years. Foot pain significantly improved since getting new shoes. Depression per HPI, doing well. Cycles are fairly regular Elbow and wrist pain improved with glucosamine/chondroitin supplement Up once or twice to void, drinks a lot of water. Some stress incontinence Some trouble sleeping--light sleeper, wakes up easily. Sometimes has trouble getting back to sleep.  PHYSICAL EXAM:  BP 128/78   Pulse 76   Temp (!) 96.8 F (36 C) (Other (Comment))   Ht 5' 6.5" (1.689 m)   Wt 153 lb 12.8 oz (69.8 kg)   BMI 24.45 kg/m   Wt Readings from Last 3 Encounters:  04/19/19 153 lb 12.8 oz (69.8 kg)  04/18/19 154 lb 6.4 oz (70 kg)  03/15/19 152 lb (68.9 kg)    General Appearance:    Alert, cooperative, no distress, appears stated age. Some throat clearing (PND/allergies)  Head:    Normocephalic,  without obvious abnormality, atraumatic  Eyes:    PERRL, conjunctiva/corneas clear, EOM's intact, fundi benign  Ears:    Normal TM's and external ear canals  Nose:   Not examined, wearing mask due to COVID-19 pandemic  Throat:   Not examined, wearing mask due to COVID-19 pandemic  Neck:   Supple, no lymphadenopathy;  thyroid:  no enlargement/ tenderness/nodules; no carotid bruit or JVD  Back:    Spine nontender, no curvature, ROM normal, no CVA tenderness  Lungs:     Clear to auscultation bilaterally without wheezes, rales or  ronchi; respirations unlabored  Chest Wall:    No tenderness or deformity   Heart:    Regular rate and rhythm, S1 and S2 normal, no murmur, rub or gallop  Breast Exam:    Deferred to GYN  Abdomen:     Soft, non-tender, nondistended, normoactive bowel sounds,    no masses, no hepatosplenomegaly  Genitalia:    Deferred to GYN     Extremities:   No clubbing, cyanosis or edema  Pulses:   2+ and symmetric all extremities  Skin:   Skin color, texture, turgor normal, no rashes or lesions  Lymph nodes:   Cervical, supraclavicular, and axillary nodes normal  Neurologic:   CNII-XII intact, normal strength, sensation and gait; reflexes 2+ and symmetric throughout                                Psych:   Normal mood, affect, hygiene and grooming.  PHQ-9 score of 3  Review of recent labs done by GYN:   Chemistry      Component Value Date/Time   NA 139 02/23/2019 0855   K 4.2 02/23/2019 0855   CL 103 02/23/2019 0855   CO2 24 02/23/2019 0855   BUN 12 02/23/2019 0855   CREATININE 0.81 02/23/2019 0855   CREATININE 0.76 12/17/2011 1020      Component Value Date/Time   CALCIUM 9.2 02/23/2019 0855   ALKPHOS 90 02/23/2019 0855   AST 17 02/23/2019 0855   ALT 11 02/23/2019 0855  BILITOT 0.4 02/23/2019 0855     Fasting glu 85  Lab Results  Component Value Date   WBC 8.5 02/23/2019   HGB 14.1 02/23/2019   HCT 43.2 02/23/2019   MCV 95 02/23/2019   PLT 220 02/23/2019    Lab Results  Component Value Date   TSH 2.510 02/23/2019   Lab Results  Component Value Date   CHOL 197 02/23/2019   HDL 57 02/23/2019   LDLCALC 120 (H) 02/23/2019   TRIG 112 02/23/2019   CHOLHDL 3.5 02/23/2019   (LDL was 111 in 2019, and 89 the year prior)  ASSESSMENT/PLAN:  Annual physical exam  At risk for colon cancer - scheduled for colonoscopy later this month  At high risk for breast cancer - UTD with mammo, breast exam and MRI  Depression, major, in remission ([place]) - not off medication long enough to know if depression could recur--to restart at 73m dose if any recurrent depression/anxiety   Discussed monthly self breast exams and yearly mammograms (and MRI due to high risk), at least 30 minutes of aerobic activity at least 5 days/week and weight-bearing exercise at least 2x/week; proper sunscreen use reviewed; healthy diet, including goals of calcium and vitamin D intake and alcohol recommendations (less than or equal to 1 drink/day) reviewed; regular seatbelt use; changing batteries in smoke detectors, carbon monoxide detectors.  Immunization recommendations discussed--Shingrix is recommended.  To check insurance and schedule [place] when convenient.  Colonoscopy recommendations reviewed, past due, scheduled.  It is recommended q5 years due to the CHEK2 gene mutation.  Discussed at length what to look for over the next few weeks regarding worsening moods/irritability/insomnia, especially given that it is winter, at risk for SAD issues as well.  If any worsening symptoms, will restart at 271m  Given that stressors are less than in the past (marital issues improved), likely can use 2015mose. Counseled some re: insomnia, sleep hygiene and some relaxation techniques.,  F/u 1 year, sooner prn

## 2019-04-18 ENCOUNTER — Encounter: Payer: Self-pay | Admitting: Gastroenterology

## 2019-04-18 ENCOUNTER — Ambulatory Visit (AMBULATORY_SURGERY_CENTER): Payer: Self-pay

## 2019-04-18 ENCOUNTER — Other Ambulatory Visit: Payer: Self-pay

## 2019-04-18 VITALS — Temp 96.8°F | Ht 66.5 in | Wt 154.4 lb

## 2019-04-18 DIAGNOSIS — Z1211 Encounter for screening for malignant neoplasm of colon: Secondary | ICD-10-CM

## 2019-04-18 MED ORDER — NA SULFATE-K SULFATE-MG SULF 17.5-3.13-1.6 GM/177ML PO SOLN: 1.0000 | Freq: Once | ORAL | 0 refills | Status: AC

## 2019-04-18 NOTE — Progress Notes (Signed)
Denies allergies to eggs or soy products. Denies complication of anesthesia or sedation. Denies use of weight loss medication. Denies use of O2.   Emmi instructions given for colonoscopy.  Covid screening is scheduled for  Thursday 04/27/19 @ 3:10 Pm. A 15.00 coupon for Suprep was given to the patient.

## 2019-04-19 ENCOUNTER — Ambulatory Visit: Payer: BC Managed Care – PPO | Admitting: Family Medicine

## 2019-04-19 ENCOUNTER — Encounter: Payer: Self-pay | Admitting: Family Medicine

## 2019-04-19 VITALS — BP 128/78 | HR 76 | Temp 96.8°F | Ht 66.5 in | Wt 153.8 lb

## 2019-04-19 DIAGNOSIS — F325 Major depressive disorder, single episode, in full remission: Secondary | ICD-10-CM | POA: Diagnosis not present

## 2019-04-19 DIAGNOSIS — Z9189 Other specified personal risk factors, not elsewhere classified: Secondary | ICD-10-CM | POA: Diagnosis not present

## 2019-04-19 DIAGNOSIS — Z0000 Encounter for general adult medical examination without abnormal findings: Secondary | ICD-10-CM | POA: Diagnosis not present

## 2019-04-27 ENCOUNTER — Other Ambulatory Visit: Payer: Self-pay | Admitting: Gastroenterology

## 2019-04-27 ENCOUNTER — Ambulatory Visit (INDEPENDENT_AMBULATORY_CARE_PROVIDER_SITE_OTHER): Payer: BC Managed Care – PPO

## 2019-04-27 DIAGNOSIS — Z1159 Encounter for screening for other viral diseases: Secondary | ICD-10-CM

## 2019-04-28 LAB — SARS CORONAVIRUS 2 (TAT 6-24 HRS): SARS Coronavirus 2: NEGATIVE

## 2019-05-02 ENCOUNTER — Ambulatory Visit (AMBULATORY_SURGERY_CENTER): Payer: BC Managed Care – PPO | Admitting: Gastroenterology

## 2019-05-02 ENCOUNTER — Encounter: Payer: Self-pay | Admitting: Gastroenterology

## 2019-05-02 ENCOUNTER — Other Ambulatory Visit: Payer: Self-pay

## 2019-05-02 VITALS — BP 121/79 | HR 56 | Temp 98.7°F | Resp 14 | Ht 66.5 in | Wt 154.4 lb

## 2019-05-02 DIAGNOSIS — K635 Polyp of colon: Secondary | ICD-10-CM | POA: Diagnosis not present

## 2019-05-02 DIAGNOSIS — Z1211 Encounter for screening for malignant neoplasm of colon: Secondary | ICD-10-CM

## 2019-05-02 DIAGNOSIS — D123 Benign neoplasm of transverse colon: Secondary | ICD-10-CM

## 2019-05-02 MED ORDER — SODIUM CHLORIDE 0.9 % IV SOLN: 500.0000 mL | Freq: Once | INTRAVENOUS | Status: DC

## 2019-05-02 NOTE — Progress Notes (Signed)
Pt tolerated well. VSS. Awake and to recovery. 

## 2019-05-02 NOTE — Op Note (Signed)
[facility] Patient Name: Annette Byrd Procedure Date: 05/02/2019 11:23 AM MRN: [id number] Endoscopist: Mauri Pole , MD Age: 53 Referring MD:  Date of Birth: 25-Jun-1965 Gender: Female Account #: [id number] Procedure:                Colonoscopy Indications:              Screening for colorectal malignant neoplasm, CHEK 2                            gene mutation Medicines:                Monitored Anesthesia Care Procedure:                Pre-Anesthesia Assessment:                           - Prior to the procedure, a History and Physical                            was performed, and patient medications and                            allergies were reviewed. The patient's tolerance of                            previous anesthesia was also reviewed. The risks                            and benefits of the procedure and the sedation                            options and risks were discussed with the patient.                            All questions were answered, and informed consent                            was obtained. Prior Anticoagulants: The patient has                            taken no previous anticoagulant or antiplatelet                            agents. ASA Grade Assessment: II - A patient with                            mild systemic disease. After reviewing the risks                            and benefits, the patient was deemed in                            satisfactory condition to undergo the procedure.  After obtaining informed consent, the colonoscope                            was passed under direct vision. Throughout the                            procedure, the patient's blood pressure, pulse, and                            oxygen saturations were monitored continuously. The                            Colonoscope was introduced through the anus and                            advanced to the the cecum, identified by                             appendiceal orifice and ileocecal valve. The                            colonoscopy was somewhat difficult due to                            inadequate bowel prep, a redundant colon and a                            tortuous colon. Successful completion of the                            procedure was aided by applying abdominal pressure                            and lavage. The patient tolerated the procedure                            well. The quality of the bowel preparation was                            adequate after extensive lavage and suction. The                            ileocecal valve, appendiceal orifice, and rectum                            were photographed. Scope In: 11:27:10 AM Scope Out: 12:02:12 PM Scope Withdrawal Time: 0 hours 14 minutes 57 seconds  Total Procedure Duration: 0 hours 35 minutes 2 seconds  Findings:                 The perianal and digital rectal examinations were                            normal.  A 15 mm polyp was found in the hepatic flexure. The                            polyp was sessile. The polyp was removed with a                            piecemeal technique using a cold snare. Resection                            and retrieval were complete.                           Non-bleeding internal hemorrhoids were found during                            retroflexion. The hemorrhoids were small.                           The exam was otherwise without abnormality. Complications:            No immediate complications. Estimated Blood Loss:     Estimated blood loss was minimal. Impression:               - One 15 mm polyp at the hepatic flexure, removed                            piecemeal using a cold snare. Resected and                            retrieved.                           - Non-bleeding internal hemorrhoids.                           - The examination was otherwise  normal. Recommendation:           - Patient has a contact number available for                            emergencies. The signs and symptoms of potential                            delayed complications were discussed with the                            patient. Return to normal activities tomorrow.                            Written discharge instructions were provided to the                            patient.                           - Resume previous diet.                           -  Continue present medications.                           - Await pathology results.                           - Repeat colonoscopy in 1 year for surveillance                            after piecemeal polypectomy and also was suboptimal                            prep.                           - For future colonoscopy the patient will require                            an extended preparation. If there are any                            questions, please contact the gastroenterologist. Mauri Pole, MD 05/02/2019 12:12:09 PM This report has been signed electronically.

## 2019-05-02 NOTE — Patient Instructions (Signed)
Handouts given for polyps and hemorrhoids.  YOU HAD AN ENDOSCOPIC PROCEDURE TODAY AT THE  ENDOSCOPY CENTER:   Refer to the procedure report that was given to you for any specific questions about what was found during the examination.  If the procedure report does not answer your questions, please call your gastroenterologist to clarify.  If you requested that your care partner not be given the details of your procedure findings, then the procedure report has been included in a sealed envelope for you to review at your convenience later.  YOU SHOULD EXPECT: Some feelings of bloating in the abdomen. Passage of more gas than usual.  Walking can help get rid of the air that was put into your GI tract during the procedure and reduce the bloating. If you had a lower endoscopy (such as a colonoscopy or flexible sigmoidoscopy) you may notice spotting of blood in your stool or on the toilet paper. If you underwent a bowel prep for your procedure, you may not have a normal bowel movement for a few days.  Please Note:  You might notice some irritation and congestion in your nose or some drainage.  This is from the oxygen used during your procedure.  There is no need for concern and it should clear up in a day or so.  SYMPTOMS TO REPORT IMMEDIATELY:   Following lower endoscopy (colonoscopy or flexible sigmoidoscopy):  Excessive amounts of blood in the stool  Significant tenderness or worsening of abdominal pains  Swelling of the abdomen that is new, acute  Fever of 100F or higher  For urgent or emergent issues, a gastroenterologist can be reached at any hour by calling [phone].   DIET:  We do recommend a small meal at first, but then you may proceed to your regular diet.  Drink plenty of fluids but you should avoid alcoholic beverages for 24 hours.  ACTIVITY:  You should plan to take it easy for the rest of today and you should NOT DRIVE or use heavy machinery until tomorrow (because of the  sedation medicines used during the test).    FOLLOW UP: Our staff will call the number listed on your records 48-72 hours following your procedure to check on you and address any questions or concerns that you may have regarding the information given to you following your procedure. If we do not reach you, we will leave a message.  We will attempt to reach you two times.  During this call, we will ask if you have developed any symptoms of COVID 19. If you develop any symptoms (ie: fever, flu-like symptoms, shortness of breath, cough etc.) before then, please call [phone].  If you test positive for Covid 19 in the 2 weeks post procedure, please call and report this information to us.    If any biopsies were taken you will be contacted by phone or by letter within the next 1-3 weeks.  Please call us at [phone] if you have not heard about the biopsies in 3 weeks.    SIGNATURES/CONFIDENTIALITY: You and/or your care partner have signed paperwork which will be entered into your electronic medical record.  These signatures attest to the fact that that the information above on your After Visit Summary has been reviewed and is understood.  Full responsibility of the confidentiality of this discharge information lies with you and/or your care-partner. 

## 2019-05-02 NOTE — Progress Notes (Signed)
Called to room to assist during endoscopic procedure.  Patient ID and intended procedure confirmed with present staff. Received instructions for my participation in the procedure from the performing physician.  

## 2019-05-02 NOTE — Progress Notes (Signed)
No change in condition since pre visit with Riki Sheer LPN

## 2019-05-04 ENCOUNTER — Telehealth: Payer: Self-pay

## 2019-05-04 NOTE — Telephone Encounter (Signed)
Left message on follow up call. 

## 2019-05-04 NOTE — Telephone Encounter (Signed)
Second follow up phone call attempt, no answer LM 

## 2019-05-10 ENCOUNTER — Encounter: Payer: Self-pay | Admitting: Gastroenterology

## 2019-07-31 ENCOUNTER — Other Ambulatory Visit: Payer: Self-pay | Admitting: Obstetrics and Gynecology

## 2019-08-02 ENCOUNTER — Other Ambulatory Visit: Payer: Self-pay | Admitting: Obstetrics and Gynecology

## 2019-08-07 ENCOUNTER — Other Ambulatory Visit: Payer: Self-pay | Admitting: Obstetrics and Gynecology

## 2019-08-08 NOTE — Telephone Encounter (Signed)
Medication refill request: Citalopram  Last AEX:  02-23-2019 JJ  Next AEX: not currently scheduled  Last MMG (if hormonal medication request): n/a Refill authorized: Today, please advise.   Medication pended for #90, 0RF. Please refill if appropriate.

## 2019-08-21 ENCOUNTER — Encounter: Payer: BC Managed Care – PPO | Admitting: Family Medicine

## 2019-11-21 ENCOUNTER — Telehealth: Payer: Self-pay

## 2019-11-21 NOTE — Telephone Encounter (Signed)
In person please

## 2019-11-21 NOTE — Telephone Encounter (Signed)
Pt. Called stating she wanted to be seen in office for her head ache, tiredness, and nausea. She has had both vaccines and has not been around anyone with covid. I scheduled her 11/27/19 because she needed a late afternoon apt. And was going out of town this week. Is this ok for in person or you want to do a virtual.

## 2019-11-26 NOTE — Progress Notes (Signed)
Chief Complaint  Patient presents with  . other    head aches, nausea, and tired started over three months ago   She has noticed 3 months (or more) of headaches, fatigue. Some nausea--sometimes with the headaches, sometimes separate. She suspects it is related to her emotions, as she tender to internalize. Wants to make sure there is nothing physical to cause her complaints before blaming emotions.  Her stressors include ongoing marital issues.  There had been infidelity, lack of trust, husband is very controlling.  Her sister doesn't like him, so they don't really have much of a relationship anymore.  There are some large expenses as well.  She feels like the headaches are different from migraines. Sometimes wakes up with them, frontal, other times develop later in the day.  She has headaches most days. Sometimes they are at the top of her head.  Sometimes gets HA during argument/stressful situation Does grind teeth and sometimes gets temporal headaches. She also finds that she presses her tongue on back of to teeth at night, causing them to shift, now has invisalign for night. She will take excedrin migraine or ibuprofen (if later in the day), takes meds most of the days (about 75% of the time).  Takes meds for allergies, doing better now than a few months ago. Allegra wasn't working as well, so switched to allertec from [employer], takes in the morning. She denies any discolored mucus, cough.  No persistent sinus pain or pain in her cheeks (just frontal).  Chart was reviewed with the patient--she had normal labs 02/2019 (c-met, lipids, CBC, TSH)  At her physical she had reported being off Citalopram.  She states that she restarted it about a month (maybe less) after her December physical. Has been back on the 43m dose for about 6 months.  She has some trouble sleeping. Often wakes up at 2-3am and has trouble getting back to sleep.  Not always to go to bathroom. Only occasional trouble  falling asleep. Sleeps up to 7 hours/night.  Appetite up and down. Some concentration difficulty She has an appt with her therapist on Friday, hadn't seen in a year.  She is also complaining of some indigestion 5 days/week. This occurs after eating, not at night.  Caffeine 3/day (plus the excedrin migraine has caffeine).   PMH, PSH, SH reviewed  Outpatient Encounter Medications as of 11/27/2019  Medication Sig  . aspirin-acetaminophen-caffeine (EXCEDRIN MIGRAINE) 250-250-65 MG per tablet Take 1 tablet by mouth every 6 (six) hours as needed.  . Calcium-Vitamin D-Vitamin K 500-100-40 MG-UNT-MCG CHEW Chew by mouth daily.  . cetirizine (ZYRTEC) 10 MG tablet Take 10 mg by mouth daily.  . citalopram (CELEXA) 40 MG tablet TAKE 1 TABLET BY MOUTH DAILY.  .Marland KitchenGlucosamine-Chondroitin-MSM 500-200-150 MG TABS Take by mouth daily.  .Marland Kitchenibuprofen (ADVIL,MOTRIN) 200 MG tablet Take 200 mg by mouth every 6 (six) hours as needed.  . Multiple Vitamin (MULTI-VITAMIN) tablet Take 1 tablet by mouth daily.  .Marland KitchenOVER THE COUNTER MEDICATION Fiber gummie, one gummie daily.  . fexofenadine (ALLEGRA) 180 MG tablet Take 180 mg by mouth daily. (Patient not taking: Reported on 11/27/2019)   No facility-administered encounter medications on file as of 11/27/2019.   No Known Allergies  ROS:  No fever, chills, URI symptoms.  Allergies, headaches, nausea, reflux and fatigue per HPI.  No bowel changes, changes to skin/hair, no temperature intolerance.  Appetite is up and down.  Has gained a few pounds. No vomiting, no numbness, tingling, word-finding trouble,  balance issues, memory concerns or other neurologic symptoms.  No urinary complaints.   PHYSICAL EXAM:  BP 118/74   Pulse 65   Temp 98.5 F (36.9 C)   Wt 159 lb (72.1 kg)   SpO2 98%   BMI 25.28 kg/m    Wt Readings from Last 3 Encounters:  11/27/19 159 lb (72.1 kg)  05/02/19 154 lb 6.4 oz (70 kg)  04/19/19 153 lb 12.8 oz (69.8 kg)   Well-appearing, pleasant  female, in no distress. HEENT: conjunctiva and sclera are clear, EOMI.  Nasal mucosa is only minimally edematous, with clear mucus noted on the left.  Sinuses are nontender. OP is clear. Nontender at temples, temporal arteries, TMJ. Neck: no lymphadenopathy, thyromegaly  Heart: regular rate and rhythm Lungs: clear bilaterally Abdomen: soft, nontender, no mass Extremities: no edema Neuro: alert and oriented, cranial nerves intact. DTR's 2+, normal gait Psych: reported some depression, she has full range of affect, was tearful/crying when discussing marital issues.  She has normal eye contact, speech, hygiene and grooming  PHQ-9 score of 7  ASSESSMENT/PLAN:  Fatigue, unspecified type - Ddx reviewed, including depression/sleep alteration, poss cetirizine, allergies. Oct labs normal, declines repeat (sx longstanding)  Mild episode of recurrent major depressive disorder ([place]) - on citalopram 96m. Sees therapist later this week. Discussed Wellbutrin XL 1521mto add to SSRI if felt to be needed after resuming therapy  Nausea - can be related to GERD, PND from allergies, related to her headaches.   Gastroesophageal reflux disease without esophagitis - Diet reviewed, info given. To try Pepcid or Prilosec for a week or two  Daily headache - Ddx reviewed--allergies, stress, mechanical (grinding), stress, rebound. Cut back on meds, some tylenol, keep HA journal    Cont with therapy as planned. Discussed Wellbutrin to add if recommended by therapist.  For headaches, we discussed use of Tylenol, less Excedrin migraine, keep journal.  Switch cetirizine/zyrtec to bedtime, or consider trying claritin instead.  Reviewed neuro sx, and to contact [place] any develop (would need to consider imaging for eval of headaches).  45 minutes spent FTF with patient today, plus additional time in chart review and documentation.  To f/u prn for any persistent/worsening symptoms (and 6 weeks after starting  Wellbutrin, IF started).

## 2019-11-27 ENCOUNTER — Other Ambulatory Visit: Payer: Self-pay

## 2019-11-27 ENCOUNTER — Encounter: Payer: Self-pay | Admitting: Family Medicine

## 2019-11-27 ENCOUNTER — Ambulatory Visit: Payer: BC Managed Care – PPO | Admitting: Family Medicine

## 2019-11-27 VITALS — BP 118/74 | HR 65 | Temp 98.5°F | Wt 159.0 lb

## 2019-11-27 DIAGNOSIS — K219 Gastro-esophageal reflux disease without esophagitis: Secondary | ICD-10-CM | POA: Diagnosis not present

## 2019-11-27 DIAGNOSIS — R519 Headache, unspecified: Secondary | ICD-10-CM

## 2019-11-27 DIAGNOSIS — F330 Major depressive disorder, recurrent, mild: Secondary | ICD-10-CM | POA: Diagnosis not present

## 2019-11-27 DIAGNOSIS — R5383 Other fatigue: Secondary | ICD-10-CM | POA: Diagnosis not present

## 2019-11-27 DIAGNOSIS — R110 Nausea: Secondary | ICD-10-CM | POA: Diagnosis not present

## 2019-11-27 NOTE — Patient Instructions (Addendum)
Try and cut back on the Excedrin Migraine. Use some Tylenol if not having muscular headaches (temples or TMJ, such as if the headache is at the top of the head) to try and minimize rebound. The caffeine can contribute to heartburn, and the aspirin and ibuprofen can also bother the stomach.  Consider keeping a headache journal with regards to location, intensity and frequency of the headaches.  I do think that depression is contributing to some of your symptoms. Your PHQ-9 score was 7 today. Let's see how things go after starting back with the therapist, and if additional treatment is recommended, let's start Wellbutrin XL 150mg  once daily as discussed. (let me know how things are going, and what your therapist says).  Try changing the allertec to bedtime.  If you are still sleepy, consider switching to claritin (loratidine), as some people get sleepy from the zyrtec/certirizine (allertec).  Your labs were all normal in October. If symptoms are not improving with all the measures discussed, and you would like them repeated, let us know.   Consider a trial of Pepcid 20mg  daily or Prilosec OTC 20mg  daily over the next 2 weeks to help with your acid reflux.  Cutting back on caffeine and certain foods as discussed and described below may be helpful.   Food Choices for Gastroesophageal Reflux Disease, Adult When you have gastroesophageal reflux disease (GERD), the foods you eat and your eating habits are very important. Choosing the right foods can help ease the discomfort of GERD. Consider working with a diet and nutrition specialist (dietitian) to help you make healthy food choices. What general guidelines should I follow?  Eating plan  Choose healthy foods low in fat, such as fruits, vegetables, whole grains, low-fat dairy products, and lean meat, fish, and poultry.  Eat frequent, small meals instead of three large meals each day. Eat your meals slowly, in a relaxed setting. Avoid bending over  or lying down until 2-3 hours after eating.  Limit high-fat foods such as fatty meats or fried foods.  Limit your intake of oils, butter, and shortening to less than 8 teaspoons each day.  Avoid the following: ? Foods that cause symptoms. These may be different for different people. Keep a food diary to keep track of foods that cause symptoms. ? Alcohol. ? Drinking large amounts of liquid with meals. ? Eating meals during the 2-3 hours before bed.  Cook foods using methods other than frying. This may include baking, grilling, or broiling. Lifestyle  Maintain a healthy weight. Ask your health care provider what weight is healthy for you. If you need to lose weight, work with your health care provider to do so safely.  Exercise for at least 30 minutes on 5 or more days each week, or as told by your health care provider.  Avoid wearing clothes that fit tightly around your waist and chest.  Do not use any products that contain nicotine or tobacco, such as cigarettes and e-cigarettes. If you need help quitting, ask your health care provider.  Sleep with the head of your bed raised. Use a wedge under the mattress or blocks under the bed frame to raise the head of the bed. What foods are not recommended? The items listed may not be a complete list. Talk with your dietitian about what dietary choices are best for you. Grains Pastries or quick breads with added fat. [place] toast. Vegetables Deep fried vegetables. [place] fries. Any vegetables prepared with added fat. Any vegetables that cause symptoms.  For some people this may include tomatoes and tomato products, chili peppers, onions and garlic, and horseradish. Fruits Any fruits prepared with added fat. Any fruits that cause symptoms. For some people this may include citrus fruits, such as oranges, grapefruit, pineapple, and lemons. Meats and other protein foods High-fat meats, such as fatty beef or pork, hot dogs, ribs, ham, sausage,  salami and bacon. Fried meat or protein, including fried fish and fried chicken. Nuts and nut butters. Dairy Whole milk and chocolate milk. Sour cream. Cream. Ice cream. Cream cheese. Milk shakes. Beverages Coffee and tea, with or without caffeine. Carbonated beverages. Sodas. Energy drinks. Fruit juice made with acidic fruits (such as orange or grapefruit). Tomato juice. Alcoholic drinks. Fats and oils Butter. Margarine. Shortening. Ghee. Sweets and desserts Chocolate and cocoa. Donuts. Seasoning and other foods Pepper. Peppermint and spearmint. Any condiments, herbs, or seasonings that cause symptoms. For some people, this may include curry, hot sauce, or vinegar-based salad dressings. Summary  When you have gastroesophageal reflux disease (GERD), food and lifestyle choices are very important to help ease the discomfort of GERD.  Eat frequent, small meals instead of three large meals each day. Eat your meals slowly, in a relaxed setting. Avoid bending over or lying down until 2-3 hours after eating.  Limit high-fat foods such as fatty meat or fried foods. This information is not intended to replace advice given to you by your health care provider. Make sure you discuss any questions you have with your health care provider. Document Revised: 08/25/2018 Document Reviewed: 05/05/2016 Elsevier Patient Education  [place].   Mindfulness-Based Stress Reduction Mindfulness-based stress reduction (MBSR) is a program that helps people learn to practice mindfulness. Mindfulness is the practice of intentionally paying attention to the present moment. It can be learned and practiced through techniques such as education, breathing exercises, meditation, and yoga. MBSR includes several mindfulness techniques in one program. MBSR works best when you understand the treatment, are willing to try new things, and can commit to spending time practicing what you learn. MBSR training may include  learning about:  How your emotions, thoughts, and reactions affect your body.  New ways to respond to things that cause negative thoughts to start (triggers).  How to notice your thoughts and let go of them.  Practicing awareness of everyday things that you normally do without thinking.  The techniques and goals of different types of meditation. What are the benefits of MBSR? MBSR can have many benefits, which include helping you to:  Develop self-awareness. This refers to knowing and understanding yourself.  Learn skills and attitudes that help you to participate in your own health care.  Learn new ways to care for yourself.  Be more accepting about how things are, and let things go.  Be less judgmental and approach things with an open mind.  Be patient with yourself and trust yourself more. MBSR has also been shown to:  Reduce negative emotions, such as depression and anxiety.  Improve memory and focus.  Change how you sense and approach pain.  Boost your body's ability to fight infections.  Help you connect better with other people.  Improve your sense of well-being. Follow these instructions at home:   Find a local in-person or online MBSR program.  Set aside some time regularly for mindfulness practice.  Find a mindfulness practice that works best for you. This may include one or more of the following: ? Meditation. Meditation involves focusing your mind on a certain  thought or activity. ? Breathing awareness exercises. These help you to stay present by focusing on your breath. ? Body scan. For this practice, you lie down and pay attention to each part of your body from head to toe. You can identify tension and soreness and intentionally relax parts of your body. ? Yoga. Yoga involves stretching and breathing, and it can improve your ability to move and be flexible. It can also provide an experience of testing your body's limits, which can help you release  stress. ? Mindful eating. This way of eating involves focusing on the taste, texture, color, and smell of each bite of food. Because this slows down eating and helps you feel full sooner, it can be an important part of a weight-loss plan.  Find a podcast or recording that provides guidance for breathing awareness, body scan, or meditation exercises. You can listen to these any time when you have a free moment to rest without distractions.  Follow your treatment plan as told by your health care provider. This may include taking regular medicines and making changes to your diet or lifestyle as recommended. How to practice mindfulness To do a basic awareness exercise:  Find a comfortable place to sit.  Pay attention to the present moment. Observe your thoughts, feelings, and surroundings just as they are.  Avoid placing judgment on yourself, your feelings, or your surroundings. Make note of any judgment that comes up, and let it go.  Your mind may wander, and that is okay. Make note of when your thoughts drift, and return your attention to the present moment. To do basic mindfulness meditation:  Find a comfortable place to sit. This may include a stable chair or a firm floor cushion. ? Sit upright with your back straight. Let your arms fall next to your side with your hands resting on your legs. ? If sitting in a chair, rest your feet flat on the floor. ? If sitting on a cushion, cross your legs in front of you.  Keep your head in a neutral position with your chin dropped slightly. Relax your jaw and rest the tip of your tongue on the roof of your mouth. Drop your gaze to the floor. You can close your eyes if you like.  Breathe normally and pay attention to your breath. Feel the air moving in and out of your nose. Feel your belly expanding and relaxing with each breath.  Your mind may wander, and that is okay. Make note of when your thoughts drift, and return your attention to your  breath.  Avoid placing judgment on yourself, your feelings, or your surroundings. Make note of any judgment or feelings that come up, let them go, and bring your attention back to your breath.  When you are ready, lift your gaze or open your eyes. Pay attention to how your body feels after the meditation. Where to find more information You can find more information about MBSR from:  Your health care provider.  Community-based meditation centers or programs.  Programs offered near you. Summary  Mindfulness-based stress reduction (MBSR) is a program that teaches you how to intentionally pay attention to the present moment. It is used with other treatments to help you cope better with daily stress, emotions, and pain.  MBSR focuses on developing self-awareness, which allows you to respond to life stress without judgment or negative emotions.  MBSR programs may involve learning different mindfulness practices, such as breathing exercises, meditation, yoga, body scan, or mindful eating.  Find a mindfulness practice that works best for you, and set aside time for it on a regular basis. This information is not intended to replace advice given to you by your health care provider. Make sure you discuss any questions you have with your health care provider. Document Revised: 04/16/2017 Document Reviewed: 09/10/2016 Elsevier Patient Education  [place].

## 2019-12-01 ENCOUNTER — Other Ambulatory Visit: Payer: Self-pay | Admitting: Obstetrics and Gynecology

## 2019-12-01 DIAGNOSIS — Z1231 Encounter for screening mammogram for malignant neoplasm of breast: Secondary | ICD-10-CM

## 2019-12-15 ENCOUNTER — Ambulatory Visit
Admission: RE | Admit: 2019-12-15 | Discharge: 2019-12-15 | Disposition: A | Discharge status: Home / Self Care | Payer: BC Managed Care – PPO | Source: Ambulatory Visit | Attending: Obstetrics and Gynecology | Admitting: Obstetrics and Gynecology

## 2019-12-15 ENCOUNTER — Other Ambulatory Visit: Payer: Self-pay

## 2019-12-15 DIAGNOSIS — Z1231 Encounter for screening mammogram for malignant neoplasm of breast: Secondary | ICD-10-CM

## 2019-12-20 ENCOUNTER — Other Ambulatory Visit: Payer: Self-pay | Admitting: Obstetrics and Gynecology

## 2019-12-20 DIAGNOSIS — R928 Other abnormal and inconclusive findings on diagnostic imaging of breast: Secondary | ICD-10-CM

## 2019-12-20 IMAGING — US ULTRASOUND RIGHT BREAST LIMITED
1 series · 12 of 12 positions shown · non-contrast
Comparison: Previous exam(s).

CLINICAL DATA: Screening recall for a possible mass in the right
breast. Note that the patient has strong family history of breast
cancer including her mother diagnosed at age 52 and her sister
diagnosed at age 49.

EXAM:
DIGITAL DIAGNOSTIC UNILATERAL RIGHT MAMMOGRAM WITH CAD AND TOMO
RIGHT BREAST ULTRASOUND

[Series 1: ultrasound right breast limited · 0.06mm/px · 12 of 12 slices shown]
[im 1/12]
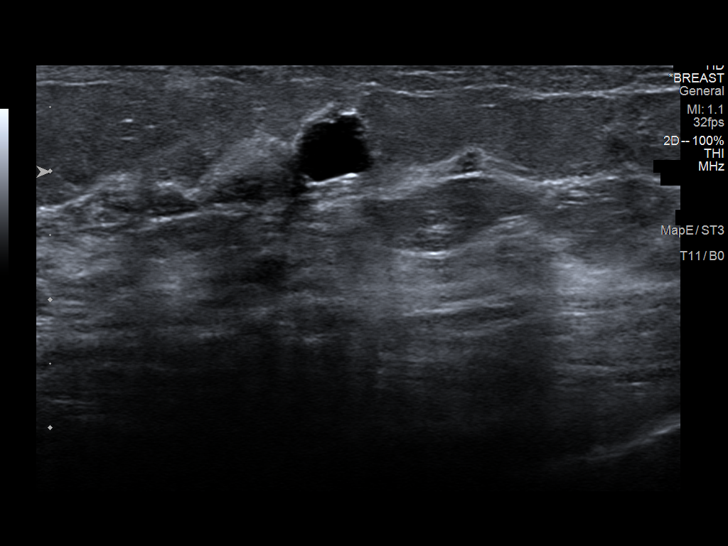
[im 2/12]
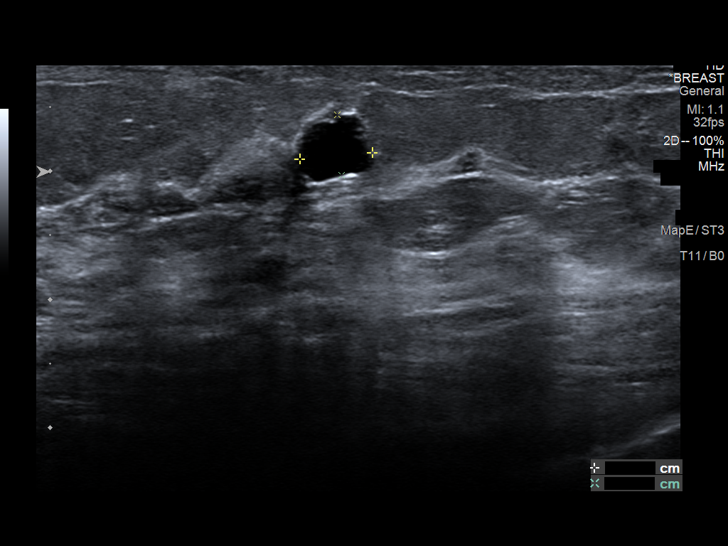
[im 3/12]
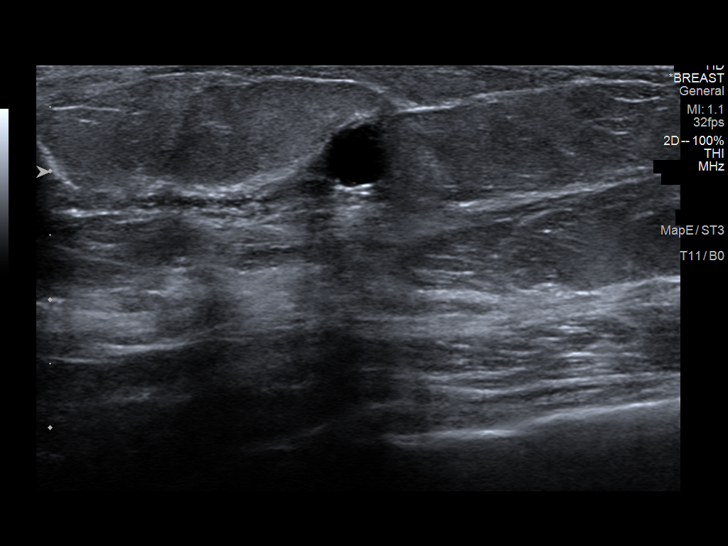
[im 4/12]
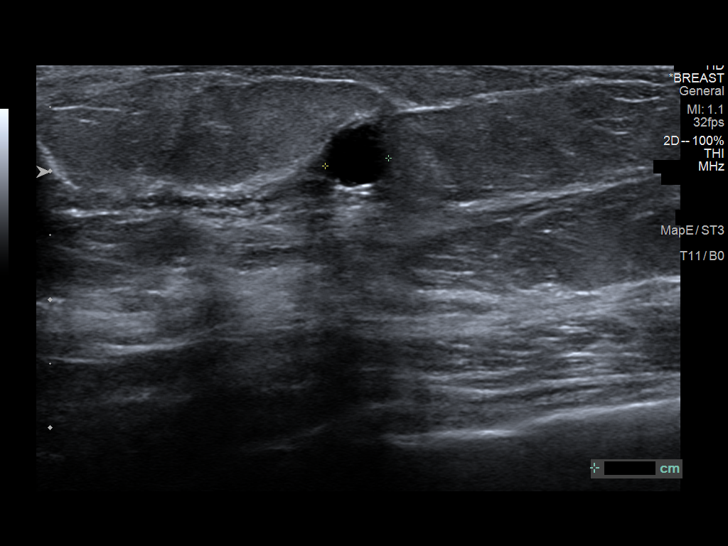
[im 5/12]
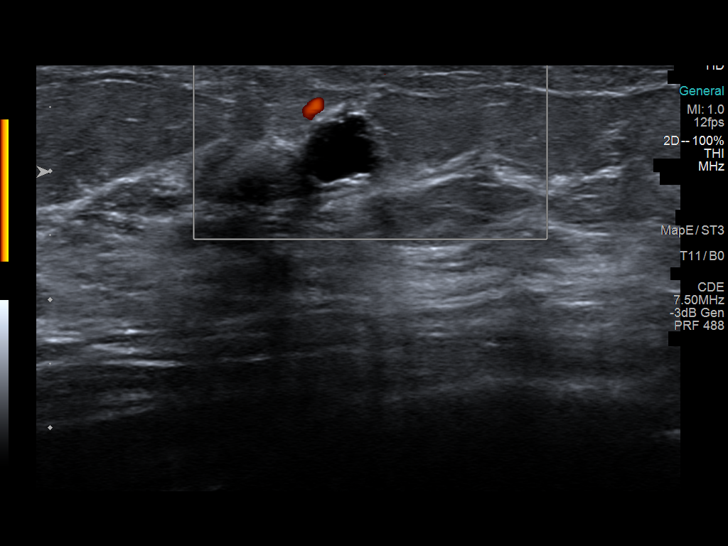
[im 6/12]
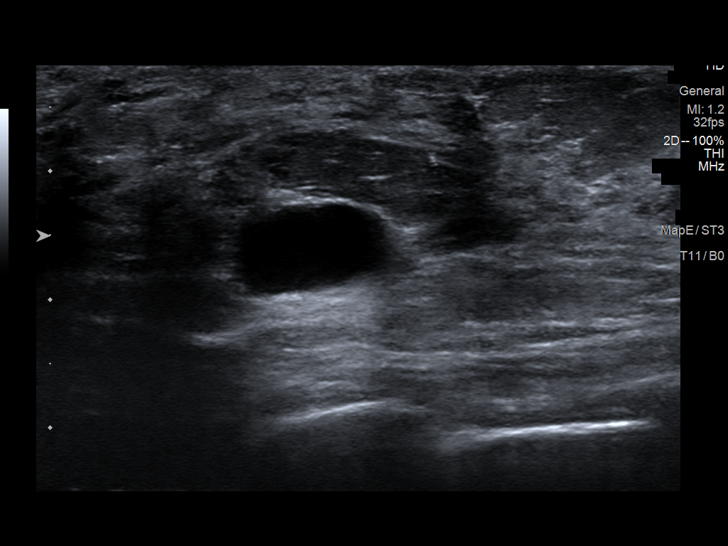
[im 7/12]
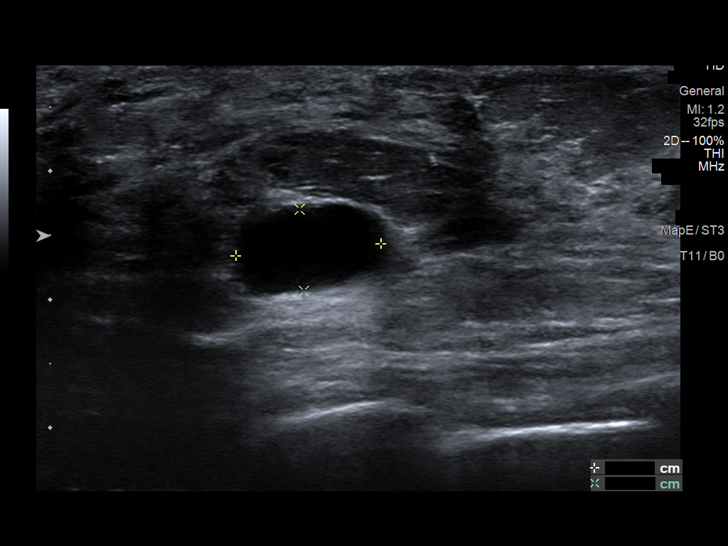
[im 8/12]
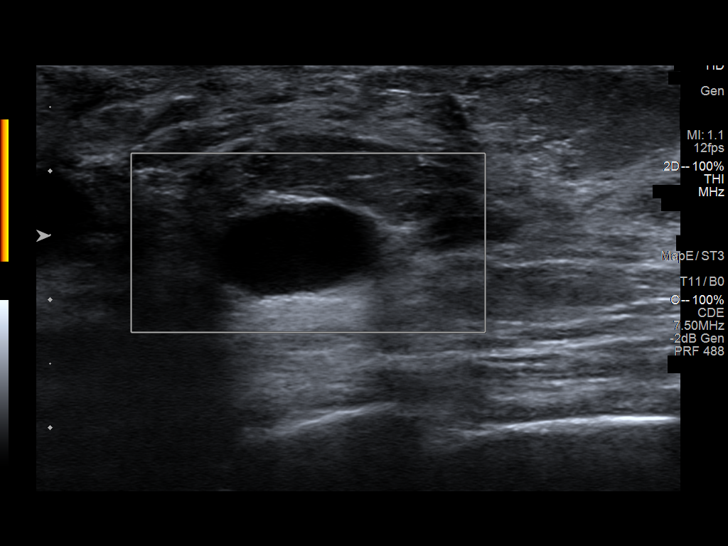
[im 9/12]
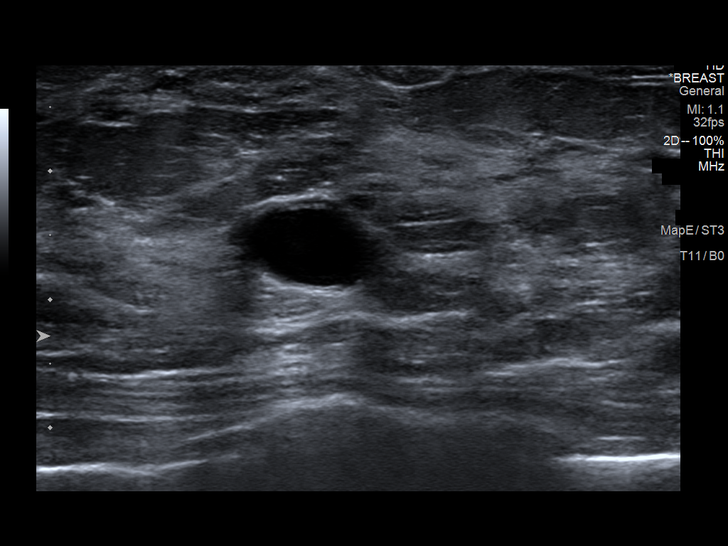
[im 10/12]
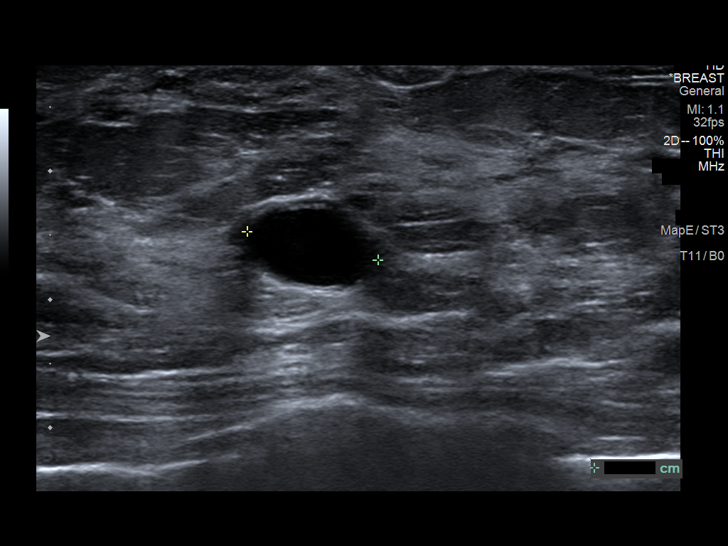
[im 11/12]
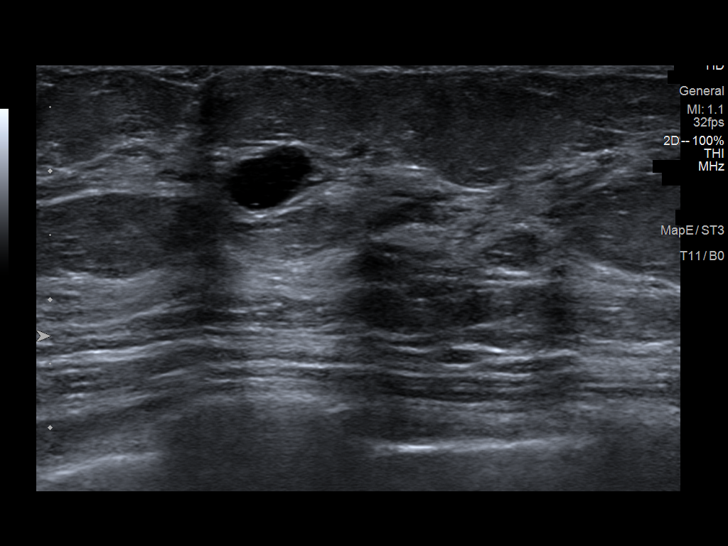
[im 12/12]
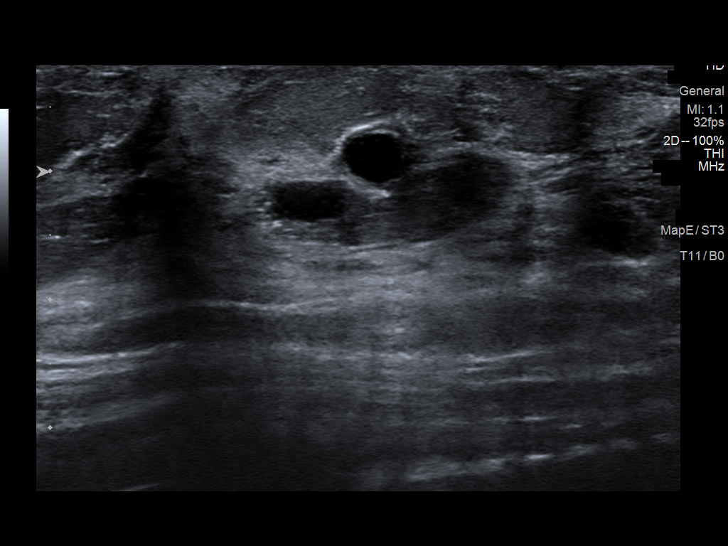

[12 of 12 positions shown; findings below may reference images not displayed]

ACR Breast Density Category c: The breast tissue is heterogeneously
dense, which may obscure small masses.
FINDINGS: The structures identified in the right breast on the screening
mammogram represent 2 different masses. There is 1 in the medial
aspect of the right breast, middle depth which is oval and
circumscribed measuring 1 cm. The other mass is in the more superior
but slightly medial aspect of the right breast, posterior depth.
This mass is obscured and measures approximately 7 mm.

Mammographic images were processed with CAD.

Numerous cysts are seen on medial aspect of the right breast. There
is a cyst in the right breast at 4 o'clock, 1 cm from the nipple
measuring 1.1 x 0.6 x 1.0 cm which likely corresponds with the
larger cyst seen on the mammogram. The smaller cyst in the right
breast seen on the mammogram likely corresponds with the 0.6 x 0.5 x
0.5 cm cyst in the right breast at 1 o'clock, 2 cm from the nipple.
IMPRESSION: The 2 masses in the right breast correspond with benign cysts.

RECOMMENDATION:
1.  Screening mammogram in one year.(Code:[id number])

2. High risk screening MRI is due for this patient in Saturday December, 2017.

I have discussed the findings and recommendations with the patient.
Results were also provided in writing at the conclusion of the
visit. If applicable, a reminder letter will be sent to the patient
regarding the next appointment.

BI-RADS CATEGORY  2: Benign.

## 2019-12-27 ENCOUNTER — Ambulatory Visit
Admission: RE | Admit: 2019-12-27 | Discharge: 2019-12-27 | Disposition: A | Discharge status: Home / Self Care | Payer: BC Managed Care – PPO | Source: Ambulatory Visit | Attending: Obstetrics and Gynecology | Admitting: Obstetrics and Gynecology

## 2019-12-27 ENCOUNTER — Ambulatory Visit: Payer: BC Managed Care – PPO

## 2019-12-27 ENCOUNTER — Other Ambulatory Visit: Payer: Self-pay

## 2019-12-27 DIAGNOSIS — R928 Other abnormal and inconclusive findings on diagnostic imaging of breast: Secondary | ICD-10-CM

## 2020-03-01 ENCOUNTER — Other Ambulatory Visit: Payer: Self-pay | Admitting: Obstetrics and Gynecology

## 2020-03-01 NOTE — Telephone Encounter (Signed)
Spoke with pt. Pt states needing refill to get to [facility] because she is currently out and she has AEX  scheduled on 03/11/20 with Dr Talbert Nan. Pt states was taking Celexa QOD, but has recently been taking daily since July. Pt states feels better on daily medication. Pt advised will send in new Rx for Celexa 40 mg #30, 0RF to get to [facility]. Pt agreeable and verbalized understanding.  Routing to Dr Talbert Nan for review.  Encounter closed.

## 2020-03-11 ENCOUNTER — Ambulatory Visit: Payer: BC Managed Care – PPO | Admitting: Obstetrics and Gynecology

## 2020-03-11 ENCOUNTER — Encounter: Payer: Self-pay | Admitting: Obstetrics and Gynecology

## 2020-03-11 ENCOUNTER — Other Ambulatory Visit: Payer: Self-pay

## 2020-03-11 VITALS — BP 122/64 | HR 72 | Ht 66.0 in | Wt 163.0 lb

## 2020-03-11 DIAGNOSIS — Z0000 Encounter for general adult medical examination without abnormal findings: Secondary | ICD-10-CM

## 2020-03-11 DIAGNOSIS — Z124 Encounter for screening for malignant neoplasm of cervix: Secondary | ICD-10-CM | POA: Diagnosis not present

## 2020-03-11 DIAGNOSIS — Z01419 Encounter for gynecological examination (general) (routine) without abnormal findings: Secondary | ICD-10-CM

## 2020-03-11 DIAGNOSIS — N939 Abnormal uterine and vaginal bleeding, unspecified: Secondary | ICD-10-CM | POA: Diagnosis not present

## 2020-03-11 MED ORDER — CITALOPRAM HYDROBROMIDE 40 MG PO TABS
40.0000 mg | ORAL_TABLET | Freq: Every day | ORAL | 3 refills | Status: DC
Start: 2020-03-11 — End: 2021-04-07

## 2020-03-11 NOTE — Progress Notes (Signed)
54 y.o. [id number] Married White or Caucasian Not Hispanic or Latino female here for annual exam. Patient states that her cycles can last for several weeks and vary in flow. She is unsure as to when her last cycle was thinks maybe August or September She reports that she does have cramping and nausea with her periods. She has a paragard IUD, placed in 1/20.   In the last year, she has skipped 3-4 months of her cycle in a row. She isn't sure how close they can be, thinks can be monthly or skips every. She can bleed for 7-14 days. Can saturate an ultras tampon in up to 1-2 hours. Doesn't stay that heavy for long. She does get some cramps, moderate, sometimes takes OTC medication.   She has had vasomotor symptoms, but not recently. No vaginal dryness. Sexually active.   Elevated risk of breast cancer, over 24%. Getting yearly mammogram and MRI's.  Elevated risk of colon cancer, needs q 5 year colonoscopy Period Pattern: (!) Irregular Dysmenorrhea: (!) Mild Dysmenorrhea Symptoms: Nausea, Cramping  No LMP recorded. (Menstrual status: IUD).          Sexually active: Yes.    The current method of family planning is IUD.    Exercising: Yes.    Walking  Smoker:  no  Health Maintenance: Pap:  12/03/2016 normal, negative HPV History of abnormal Pap:  no MMG:  12/27/19 Diag tomo left density C Bi-rads 1 neg; 12/15/19 bilateral mammogram, recommended diagnostic left imaging.  Breast MRI 04/07/09 Benign, cat 2.  BMD:   NA Colonoscopy: 12/20, precancerous polyp, needs colonoscopy again in 12/21.  TDaP:  02/23/2019  Gardasil: none    reports that she has never smoked. She has never used smokeless tobacco. She reports current alcohol use of about 3.0 standard drinks of alcohol per week. She reports that she does not use drugs. Teaches HS choir. 2 daughters, both in college. Youngest at [employer], oldest at [place].   Past Medical History:  Diagnosis Date  . 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  Allergy   . Anxiety and depression   . 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 At risk for colon cancer    CHEK2 gene mutation  . Family history of breast cancer   . Migraine    in her 20's, improved after having children    Past Surgical History:  Procedure Laterality Date  . BREAST BIOPSY Left 01/02/2013   calcifications and FBC  . BREAST BIOPSY Right 11/2018   fibroadenoma  . INTRAUTERINE DEVICE INSERTION     inserted 7/09  . TONSILLECTOMY AND ADENOIDECTOMY      Current Outpatient Medications  Medication Sig Dispense Refill  . aspirin-acetaminophen-caffeine (EXCEDRIN MIGRAINE) 250-250-65 MG per tablet Take 1 tablet by mouth every 6 (six) hours as needed.    . Calcium-Vitamin D-Vitamin K 500-100-40 MG-UNT-MCG CHEW Chew by mouth daily.    . cetirizine (ZYRTEC) 10 MG tablet Take 10 mg by mouth daily.    . citalopram (CELEXA) 40 MG tablet TAKE 1 TABLET BY MOUTH DAILY. 30 tablet 0  . Glucosamine-Chondroitin-MSM 500-200-150 MG TABS Take by mouth daily.    Marland Kitchen ibuprofen (ADVIL,MOTRIN) 200 MG tablet Take 200 mg by mouth every 6 (six) hours as needed.    . Multiple Vitamin (MULTI-VITAMIN) tablet Take 1 tablet by mouth daily.     No current facility-administered medications for this visit.    Family History  Problem Relation Age of Onset  . Hypertension Mother   . Breast cancer Mother 6       treated with lumpectomy and radiation  . Thyroid disease Mother   . Hyperlipidemia Mother   . Hyperlipidemia Father   . Hypertension Father   . Sleep apnea Father   . Breast cancer Other   . Breast cancer Sister 49       2015 - myRisk (25 gene) negative. Treated with lumpectomy and radiation  . Heart disease Maternal Uncle        d.75  . Heart disease Paternal Uncle   . Leukemia Maternal Grandmother        d.70s -shortly after diagnosis  . Pancreatic cancer Paternal Grandmother 82       d.70s  . Colon cancer Neg Hx   . Esophageal cancer Neg Hx   . Rectal cancer  Neg Hx   . Stomach cancer Neg Hx   Father diagnosed with dementia this year.   Review of Systems  All other systems reviewed and are negative.   Exam:   BP 122/64   Pulse 72   Ht 5' 6"  (1.676 m)   Wt 163 lb (73.9 kg)   SpO2 98%   BMI 26.31 kg/m   Weight change: @WEIGHTCHANGE @ Height:   Height: 5' 6"  (167.6 cm)  Ht Readings from Last 3 Encounters:  03/11/20 5' 6"  (1.676 m)  05/02/19 5' 6.5" (1.689 m)  04/19/19 5' 6.5" (1.689 m)    General appearance: alert, cooperative and appears stated age Head: Normocephalic, without obvious abnormality, atraumatic Neck: no adenopathy, supple, symmetrical, trachea midline and thyroid normal to inspection and palpation Lungs: clear to auscultation bilaterally Cardiovascular: regular rate and rhythm Breasts: normal appearance, no masses or tenderness Abdomen: soft, non-tender; non distended,  no masses,  no organomegaly Extremities: extremities normal, atraumatic, no cyanosis or edema Skin: Skin color, texture, turgor normal. No rashes or lesions Lymph nodes: Cervical, supraclavicular, and axillary nodes normal. No abnormal inguinal nodes palpated Neurologic: Grossly normal   Pelvic: External genitalia:  no lesions              Urethra:  normal appearing urethra with no masses, tenderness or lesions              Bartholins and Skenes: normal                 Vagina: normal appearing vagina with normal color and discharge, no lesions              Cervix: no lesions and IUD string 3 cm               Bimanual Exam:  Uterus:  normal size, contour, position, consistency, mobility, non-tender              Adnexa: no mass, fullness, tenderness               Rectovaginal: Confirms               Anus:  normal sphincter tone, no lesions  Gae Dry chaperoned for the exam.  A:  Well Woman with normal exam  AUB  Paragard IUD  Elevated risk of breast and colon cancer  P:   Pap with hpv  Mammogram UTD  Breast MRI due the end of next month,  will order  Colonoscopy due in 12/21  Return for GYN ultrasound   Screening labs, TSH

## 2020-03-11 NOTE — Patient Instructions (Signed)
EXERCISE AND DIET:  We recommended that you start or continue a regular exercise program for good health. Regular exercise means any activity that makes your heart beat faster and makes you sweat.  We recommend exercising at least 30 minutes per day at least 3 days a week, preferably 4 or 5.  We also recommend a diet low in fat and sugar.  Inactivity, poor dietary choices and obesity can cause diabetes, heart attack, stroke, and kidney damage, among others.    ALCOHOL AND SMOKING:  Women should limit their alcohol intake to no more than 7 drinks/beers/glasses of wine (combined, not each!) per week. Moderation of alcohol intake to this level decreases your risk of breast cancer and liver damage. And of course, no recreational drugs are part of a healthy lifestyle.  And absolutely no smoking or even second hand smoke. Most people know smoking can cause heart and lung diseases, but did you know it also contributes to weakening of your bones? Aging of your skin?  Yellowing of your teeth and nails?  CALCIUM AND VITAMIN D:  Adequate intake of calcium and Vitamin D are recommended.  The recommendations for exact amounts of these supplements seem to change often, but generally speaking 1,000 mg of calcium (between diet and supplement) and 800 units of Vitamin D per day seems prudent. Certain women may benefit from higher intake of Vitamin D.  If you are among these women, your doctor will have told you during your visit.    PAP SMEARS:  Pap smears, to check for cervical cancer or precancers,  have traditionally been done yearly, although recent scientific advances have shown that most women can have pap smears less often.  However, every woman still should have a physical exam from her gynecologist every year. It will include a breast check, inspection of the vulva and vagina to check for abnormal growths or skin changes, a visual exam of the cervix, and then an exam to evaluate the size and shape of the uterus and  ovaries.  And after 53 years of age, a rectal exam is indicated to check for rectal cancers. We will also provide age appropriate advice regarding health maintenance, like when you should have certain vaccines, screening for sexually transmitted diseases, bone density testing, colonoscopy, mammograms, etc.   MAMMOGRAMS:  All women over 40 years old should have a yearly mammogram. Many facilities now offer a "3D" mammogram, which may cost around $50 extra out of pocket. If possible,  we recommend you accept the option to have the 3D mammogram performed.  It both reduces the number of women who will be called back for extra views which then turn out to be normal, and it is better than the routine mammogram at detecting truly abnormal areas.    COLON CANCER SCREENING: Now recommend starting at age 45. At this time colonoscopy is not covered for routine screening until 50. There are take home tests that can be done between 45-49.   COLONOSCOPY:  Colonoscopy to screen for colon cancer is recommended for all women at age 50.  We know, you hate the idea of the prep.  We agree, BUT, having colon cancer and not knowing it is worse!!  Colon cancer so often starts as a polyp that can be seen and removed at colonscopy, which can quite literally save your life!  And if your first colonoscopy is normal and you have no family history of colon cancer, most women don't have to have it again for   10 years.  Once every ten years, you can do something that may end up saving your life, right?  We will be happy to help you get it scheduled when you are ready.  Be sure to check your insurance coverage so you understand how much it will cost.  It may be covered as a preventative service at no cost, but you should check your particular policy.      Breast Self-Awareness Breast self-awareness means being familiar with how your breasts look and feel. It involves checking your breasts regularly and reporting any changes to your  health care provider. Practicing breast self-awareness is important. A change in your breasts can be a sign of a serious medical problem. Being familiar with how your breasts look and feel allows you to find any problems early, when treatment is more likely to be successful. All women should practice breast self-awareness, including women who have had breast implants. How to do a breast self-exam One way to learn what is normal for your breasts and whether your breasts are changing is to do a breast self-exam. To do a breast self-exam: Look for Changes  1. Remove all the clothing above your waist. 2. Stand in front of a mirror in a room with good lighting. 3. Put your hands on your hips. 4. Push your hands firmly downward. 5. Compare your breasts in the mirror. Look for differences between them (asymmetry), such as: ? Differences in shape. ? Differences in size. ? Puckers, dips, and bumps in one breast and not the other. 6. Look at each breast for changes in your skin, such as: ? Redness. ? Scaly areas. 7. Look for changes in your nipples, such as: ? Discharge. ? Bleeding. ? Dimpling. ? Redness. ? A change in position. Feel for Changes Carefully feel your breasts for lumps and changes. It is best to do this while lying on your back on the floor and again while sitting or standing in the shower or tub with soapy water on your skin. Feel each breast in the following way:  Place the arm on the side of the breast you are examining above your head.  Feel your breast with the other hand.  Start in the nipple area and make  inch (2 cm) overlapping circles to feel your breast. Use the pads of your three middle fingers to do this. Apply light pressure, then medium pressure, then firm pressure. The light pressure will allow you to feel the tissue closest to the skin. The medium pressure will allow you to feel the tissue that is a little deeper. The firm pressure will allow you to feel the tissue  close to the ribs.  Continue the overlapping circles, moving downward over the breast until you feel your ribs below your breast.  Move one finger-width toward the center of the body. Continue to use the  inch (2 cm) overlapping circles to feel your breast as you move slowly up toward your collarbone.  Continue the up and down exam using all three pressures until you reach your armpit.  Write Down What You Find  Write down what is normal for each breast and any changes that you find. Keep a written record with breast changes or normal findings for each breast. By writing this information down, you do not need to depend only on memory for size, tenderness, or location. Write down where you are in your menstrual cycle, if you are still menstruating. If you are having trouble noticing differences   in your breasts, do not get discouraged. With time you will become more familiar with the variations in your breasts and more comfortable with the exam. How often should I examine my breasts? Examine your breasts every month. If you are breastfeeding, the best time to examine your breasts is after a feeding or after using a breast pump. If you menstruate, the best time to examine your breasts is 5-7 days after your period is over. During your period, your breasts are lumpier, and it may be more difficult to notice changes. When should I see my health care provider? See your health care provider if you notice:  A change in shape or size of your breasts or nipples.  A change in the skin of your breast or nipples, such as a reddened or scaly area.  Unusual discharge from your nipples.  A lump or thick area that was not there before.  Pain in your breasts.  Anything that concerns you.  

## 2020-03-12 ENCOUNTER — Telehealth: Payer: Self-pay | Admitting: *Deleted

## 2020-03-12 DIAGNOSIS — Z9189 Other specified personal risk factors, not elsewhere classified: Secondary | ICD-10-CM

## 2020-03-12 DIAGNOSIS — Z1231 Encounter for screening mammogram for malignant neoplasm of breast: Secondary | ICD-10-CM

## 2020-03-12 DIAGNOSIS — Z803 Family history of malignant neoplasm of breast: Secondary | ICD-10-CM

## 2020-03-12 DIAGNOSIS — Z1239 Encounter for other screening for malignant neoplasm of breast: Secondary | ICD-10-CM

## 2020-03-12 LAB — COMPREHENSIVE METABOLIC PANEL WITH GFR
ALT: 12 IU/L (ref 0–32)
AST: 21 IU/L (ref 0–40)
Albumin/Globulin Ratio: 2 (ref 1.2–2.2)
Albumin: 4.3 g/dL (ref 3.8–4.9)
Alkaline Phosphatase: 92 IU/L (ref 44–121)
BUN/Creatinine Ratio: 18 (ref 9–23)
BUN: 14 mg/dL (ref 6–24)
Bilirubin Total: <0.2 mg/dL (ref 0.0–1.2)
CO2: 25 mmol/L (ref 20–29)
Calcium: 8.9 mg/dL (ref 8.7–10.2)
Chloride: 103 mmol/L (ref 96–106)
Creatinine, Ser: 0.77 mg/dL (ref 0.57–1.00)
GFR calc Af Amer: 102 mL/min/1.73 (ref >59)
GFR calc non Af Amer: 88 mL/min/1.73 (ref >59)
Globulin, Total: 2.1 g/dL (ref 1.5–4.5)
Glucose: 66 mg/dL (ref 65–99)
Potassium: 4 mmol/L (ref 3.5–5.2)
Sodium: 140 mmol/L (ref 134–144)
Total Protein: 6.4 g/dL (ref 6.0–8.5)

## 2020-03-12 LAB — CBC
Hematocrit: 40.9 % (ref 34.0–46.6)
Hemoglobin: 13.5 g/dL (ref 11.1–15.9)
MCH: 30.4 pg (ref 26.6–33.0)
MCHC: 33 g/dL (ref 31.5–35.7)
MCV: 92 fL (ref 79–97)
Platelets: 215 10*3/uL (ref 150–450)
RBC: 4.44 x10E6/uL (ref 3.77–5.28)
RDW: 12.9 % (ref 11.7–15.4)
WBC: 9.8 10*3/uL (ref 3.4–10.8)

## 2020-03-12 LAB — TSH: TSH: 2.91 u[IU]/mL (ref 0.450–4.500)

## 2020-03-12 LAB — LIPID PANEL
Chol/HDL Ratio: 3.2 ratio (ref 0.0–4.4)
Cholesterol, Total: 187 mg/dL (ref 100–199)
HDL: 58 mg/dL (ref >39)
LDL Chol Calc (NIH): 103 mg/dL — ABNORMAL HIGH (ref 0–99)
Triglycerides: 148 mg/dL (ref 0–149)
VLDL Cholesterol Cal: 26 mg/dL (ref 5–40)

## 2020-03-12 NOTE — Telephone Encounter (Signed)
Call placed to patient, left detailed message on mobile number, ok per dpr, name identified on voicemail. Advised order has been placed for Screening Bilateral breast MRI w/wo contrast at [facility], they will contact you directly to schedule. Last MRI was completed on 04/08/19, recommended scheduling after this date. Our office will precert once scheduled. Return call to office if any additional questions.  Placed in [place] hold.   Encounter closed.

## 2020-03-12 NOTE — Telephone Encounter (Signed)
-----   Message from Salvadore Dom, MD sent at 03/11/2020  4:28 PM EDT ----- Needs breast MRI at the end of November. Elevated risk of breast cancer. Thanks, Sharee Pimple

## 2020-03-13 ENCOUNTER — Other Ambulatory Visit (HOSPITAL_COMMUNITY)
Admission: RE | Admit: 2020-03-13 | Discharge: 2020-03-13 | Disposition: A | Discharge status: Home / Self Care | Payer: BC Managed Care – PPO | Source: Ambulatory Visit | Attending: Obstetrics and Gynecology | Admitting: Obstetrics and Gynecology

## 2020-03-13 ENCOUNTER — Telehealth: Payer: Self-pay

## 2020-03-13 DIAGNOSIS — Z124 Encounter for screening for malignant neoplasm of cervix: Secondary | ICD-10-CM | POA: Insufficient documentation

## 2020-03-13 NOTE — Telephone Encounter (Signed)
Call to patient. Per DPR, OK to leave message on voicemail.   Left voicemail requesting a return call to Annette Byrd to review benefits and schedule recommended Pelvic ultrasound with Jill Jertson, MD 

## 2020-03-13 NOTE — Addendum Note (Signed)
Addended by: Dorothy Spark on: 03/13/2020 08:54 AM   Modules accepted: Orders

## 2020-03-14 LAB — CYTOLOGY - PAP
Comment: NEGATIVE
Diagnosis: NEGATIVE
High risk HPV: NEGATIVE

## 2020-03-21 NOTE — Telephone Encounter (Signed)
Unable to leave voicemail, due to mailbox being full, requesting a return call to [facility] to review benefits and schedule recommended Pelvic Ultrasound with Sumner Boast, MD.

## 2020-03-28 ENCOUNTER — Encounter: Payer: Self-pay | Admitting: Gastroenterology

## 2020-03-28 NOTE — Telephone Encounter (Signed)
Please send her a letter

## 2020-03-28 NOTE — Telephone Encounter (Signed)
Unable to leave voicemail, due to mailbox being full, requesting a return call to [facility] to review benefits and schedule recommended Pelvic Ultrasound with Sumner Boast, MD.  Routing to Dr. Talbert Nan - Third attempt to reach patient regarding scheduling pelvic ultrasound. Please advise.

## 2020-04-02 NOTE — Telephone Encounter (Signed)
Left message for pt to return call to triage RN. 

## 2020-04-03 NOTE — Telephone Encounter (Signed)
Pt returned call. Spoke with pt. Pt advised PUS per Dr Talbert Nan. Pt agreeable and verbalized understanding.  Pt scheduled for PUS on 11/23 at 1 pm. Pt aware will get a call with results from Dr Talbert Nan. PUS only visit. Pt verbalized understanding.  Pt aware of benefits per Hayley.  Routing to Dr Talbert Nan for update  Encounter closed

## 2020-04-09 ENCOUNTER — Ambulatory Visit: Payer: BC Managed Care – PPO

## 2020-04-09 ENCOUNTER — Other Ambulatory Visit: Payer: Self-pay

## 2020-04-09 DIAGNOSIS — N939 Abnormal uterine and vaginal bleeding, unspecified: Secondary | ICD-10-CM | POA: Diagnosis not present

## 2020-04-09 NOTE — Progress Notes (Signed)
Patient ID: Annette Byrd, female   DOB: 1966/01/10, 54 y.o.   MRN: [id number]

## 2020-04-10 ENCOUNTER — Ambulatory Visit
Admission: RE | Admit: 2020-04-10 | Discharge: 2020-04-10 | Disposition: A | Discharge status: Home / Self Care | Payer: BC Managed Care – PPO | Source: Ambulatory Visit | Attending: Obstetrics and Gynecology | Admitting: Obstetrics and Gynecology

## 2020-04-10 ENCOUNTER — Ambulatory Visit (INDEPENDENT_AMBULATORY_CARE_PROVIDER_SITE_OTHER): Payer: BC Managed Care – PPO

## 2020-04-10 DIAGNOSIS — Z803 Family history of malignant neoplasm of breast: Secondary | ICD-10-CM

## 2020-04-10 DIAGNOSIS — Z23 Encounter for immunization: Secondary | ICD-10-CM | POA: Diagnosis not present

## 2020-04-10 DIAGNOSIS — Z9189 Other specified personal risk factors, not elsewhere classified: Secondary | ICD-10-CM

## 2020-04-10 DIAGNOSIS — Z1239 Encounter for other screening for malignant neoplasm of breast: Secondary | ICD-10-CM

## 2020-04-10 MED ORDER — GADOBUTROL 1 MMOL/ML IV SOLN
7.0000 mL | Freq: Once | INTRAVENOUS | Status: AC | PRN
  Administered 2020-04-10: 7 mL via INTRAVENOUS

## 2020-04-25 ENCOUNTER — Other Ambulatory Visit: Payer: Self-pay

## 2020-04-25 ENCOUNTER — Ambulatory Visit (AMBULATORY_SURGERY_CENTER): Payer: Self-pay

## 2020-04-25 VITALS — Ht 66.0 in | Wt 162.0 lb

## 2020-04-25 DIAGNOSIS — Z86010 Personal history of colonic polyps: Secondary | ICD-10-CM

## 2020-04-25 MED ORDER — SUTAB 1479-225-188 MG PO TABS: 1.0000 | ORAL_TABLET | ORAL | 0 refills | Status: DC

## 2020-04-25 NOTE — Progress Notes (Signed)
Denies allergies to eggs or soy products. Denies complication of anesthesia or sedation. Denies use of weight loss medication. Denies use of O2.   Emmi instructions given for colonoscopy.  Patient completed Covid Vaccinations.

## 2020-05-02 ENCOUNTER — Encounter: Payer: Self-pay | Admitting: Gastroenterology

## 2020-05-09 ENCOUNTER — Other Ambulatory Visit: Payer: Self-pay

## 2020-05-09 ENCOUNTER — Telehealth: Payer: Self-pay | Admitting: Gastroenterology

## 2020-05-09 ENCOUNTER — Encounter: Payer: Self-pay | Admitting: Gastroenterology

## 2020-05-09 ENCOUNTER — Ambulatory Visit (AMBULATORY_SURGERY_CENTER): Payer: BC Managed Care – PPO | Admitting: Gastroenterology

## 2020-05-09 VITALS — BP 125/78 | HR 59 | Temp 97.9°F | Resp 14 | Ht 66.0 in | Wt 162.0 lb

## 2020-05-09 DIAGNOSIS — Z86010 Personal history of colonic polyps: Secondary | ICD-10-CM | POA: Diagnosis present

## 2020-05-09 DIAGNOSIS — D120 Benign neoplasm of cecum: Secondary | ICD-10-CM

## 2020-05-09 DIAGNOSIS — K635 Polyp of colon: Secondary | ICD-10-CM | POA: Diagnosis not present

## 2020-05-09 MED ORDER — SODIUM CHLORIDE 0.9 % IV SOLN: 500.0000 mL | Freq: Once | INTRAVENOUS | Status: DC

## 2020-05-09 NOTE — Progress Notes (Signed)
Called to room to assist during endoscopic procedure.  Patient ID and intended procedure confirmed with present staff. Received instructions for my participation in the procedure from the performing physician.  

## 2020-05-09 NOTE — Progress Notes (Signed)
Report to PACU, RN, vss, BBS= Clear.  

## 2020-05-09 NOTE — Telephone Encounter (Signed)
Pt is requesting a call back from a nurse to discuss her procedure she has scheduled for today @4 :00pm, pt states she threw up 50 minutes after taking the SUTAB prep. Pt wants to know if she needs to keep her appt.

## 2020-05-09 NOTE — Patient Instructions (Signed)
YOU HAD AN ENDOSCOPIC PROCEDURE TODAY AT THE [facility]:   Refer to the procedure report that was given to you for any specific questions about what was found during the examination.  If the procedure report does not answer your questions, please call your gastroenterologist to clarify.  If you requested that your care partner not be given the details of your procedure findings, then the procedure report has been included in a sealed envelope for you to review at your convenience later.  YOU SHOULD EXPECT: Some feelings of bloating in the abdomen. Passage of more gas than usual.  Walking can help get rid of the air that was put into your GI tract during the procedure and reduce the bloating. If you had a lower endoscopy (such as a colonoscopy or flexible sigmoidoscopy) you may notice spotting of blood in your stool or on the toilet paper. If you underwent a bowel prep for your procedure, you may not have a normal bowel movement for a few days.  Please Note:  You might notice some irritation and congestion in your nose or some drainage.  This is from the oxygen used during your procedure.  There is no need for concern and it should clear up in a day or so.  SYMPTOMS TO REPORT IMMEDIATELY:   Following lower endoscopy (colonoscopy or flexible sigmoidoscopy):  Excessive amounts of blood in the stool  Significant tenderness or worsening of abdominal pains  Swelling of the abdomen that is new, acute  Fever of 100F or higher   Following upper endoscopy (EGD)  Vomiting of blood or coffee ground material  New chest pain or pain under the shoulder blades  Painful or persistently difficult swallowing  New shortness of breath  Fever of 100F or higher  Black, tarry-looking stools  For urgent or emergent issues, a gastroenterologist can be reached at any hour by calling [phone]. Do not use MyChart messaging for urgent concerns.    DIET:  We do recommend a small meal at first, but  then you may proceed to your regular diet.  Drink plenty of fluids but you should avoid alcoholic beverages for 24 hours.  ACTIVITY:  You should plan to take it easy for the rest of today and you should NOT DRIVE or use heavy machinery until tomorrow (because of the sedation medicines used during the test).    FOLLOW UP: Our staff will call the number listed on your records 48-72 hours following your procedure to check on you and address any questions or concerns that you may have regarding the information given to you following your procedure. If we do not reach you, we will leave a message.  We will attempt to reach you two times.  During this call, we will ask if you have developed any symptoms of COVID 19. If you develop any symptoms (ie: fever, flu-like symptoms, shortness of breath, cough etc.) before then, please call [phone].  If you test positive for Covid 19 in the 2 weeks post procedure, please call and report this information to us.    If any biopsies were taken you will be contacted by phone or by letter within the next 1-3 weeks.  Please call us at [phone] if you have not heard about the biopsies in 3 weeks.    SIGNATURES/CONFIDENTIALITY: You and/or your care partner have signed paperwork which will be entered into your electronic medical record.  These signatures attest to the fact that that the information above on   your After Visit Summary has been reviewed and is understood.  Full responsibility of the confidentiality of this discharge information lies with you and/or your care-partner. 

## 2020-05-09 NOTE — Op Note (Addendum)
[facility] Patient Name: Annette Byrd Procedure Date: 05/09/2020 3:38 PM MRN: [id number] Endoscopist: Mauri Pole , MD Age: 54 Referring MD:  Date of Birth: 1965/08/31 Gender: Female Account #: [id number] Procedure:                Colonoscopy Indications:              High risk colon cancer surveillance: Personal                            history of colonic polyps, High risk colon cancer                            surveillance: Personal history of adenoma (10 mm or                            greater in size). CHEK 2 Mutation Medicines:                Monitored Anesthesia Care Procedure:                Pre-Anesthesia Assessment:                           - Prior to the procedure, a History and Physical                            was performed, and patient medications and                            allergies were reviewed. The patient's tolerance of                            previous anesthesia was also reviewed. The risks                            and benefits of the procedure and the sedation                            options and risks were discussed with the patient.                            All questions were answered, and informed consent                            was obtained. Prior Anticoagulants: The patient has                            taken no previous anticoagulant or antiplatelet                            agents. ASA Grade Assessment: II - A patient with                            mild systemic disease. After reviewing the risks  and benefits, the patient was deemed in                            satisfactory condition to undergo the procedure.                           After obtaining informed consent, the colonoscope                            was passed under direct vision. Throughout the                            procedure, the patient's blood pressure, pulse, and                            oxygen saturations were  monitored continuously. The                            Olympus PCF-H190DL [id number]) Colonoscope was                            introduced through the anus and advanced to the the                            cecum, identified by appendiceal orifice and                            ileocecal valve. The colonoscopy was performed                            without difficulty. The patient tolerated the                            procedure well. The quality of the bowel                            preparation was good. The ileocecal valve,                            appendiceal orifice, and rectum were photographed. Scope In: 3:42:27 PM Scope Out: 4:08:06 PM Scope Withdrawal Time: 0 hours 9 minutes 56 seconds  Total Procedure Duration: 0 hours 25 minutes 39 seconds  Findings:                 The perianal and digital rectal examinations were                            normal.                           A 2 mm polyp was found in the appendiceal orifice.                            The polyp was sessile. The polyp was removed with a  cold biopsy forceps. Resection and retrieval were                            complete.                           Non-bleeding internal hemorrhoids were found during                            retroflexion. The hemorrhoids were small. Complications:            No immediate complications. Estimated Blood Loss:     Estimated blood loss was minimal. Impression:               - One 2 mm polyp at the appendiceal orifice,                            removed with a cold biopsy forceps. Resected and                            retrieved.                           - Non-bleeding internal hemorrhoids. Recommendation:           - Patient has a contact number available for                            emergencies. The signs and symptoms of potential                            delayed complications were discussed with the                            patient.  Return to normal activities tomorrow.                            Written discharge instructions were provided to the                            patient.                           - Resume previous diet.                           - Continue present medications.                           - Await pathology results.                           - Repeat colonoscopy in 5 years for surveillance                            based on pathology results and due to history of  CHEK 2 mutation. Mauri Pole, MD 05/09/2020 4:13:14 PM This report has been signed electronically.

## 2020-05-09 NOTE — Progress Notes (Signed)
Vitals-CW ?

## 2020-05-09 NOTE — Telephone Encounter (Signed)
Returned pts call.  She states that she was able to keep down prep last night but this morning had vomiting 50 mins after taking pills.  She says that her stools were liquid the last time she went.  I encouraged her to continue drinking liquids up until her 1:00 pm cut off time and to call us back if her stools are not all liquid and clear to yellow.  Pt verbalized understanding.

## 2020-05-13 ENCOUNTER — Telehealth: Payer: Self-pay

## 2020-05-13 NOTE — Telephone Encounter (Signed)
LVM

## 2020-05-14 ENCOUNTER — Other Ambulatory Visit: Payer: Self-pay | Admitting: Medical

## 2020-05-14 ENCOUNTER — Other Ambulatory Visit (INDEPENDENT_AMBULATORY_CARE_PROVIDER_SITE_OTHER): Payer: BC Managed Care – PPO

## 2020-05-14 DIAGNOSIS — Z20822 Contact with and (suspected) exposure to covid-19: Secondary | ICD-10-CM | POA: Diagnosis not present

## 2020-05-14 DIAGNOSIS — J029 Acute pharyngitis, unspecified: Secondary | ICD-10-CM

## 2020-05-14 LAB — POC COVID19 BINAXNOW: SARS Coronavirus 2 Ag: NEGATIVE

## 2020-05-14 NOTE — Progress Notes (Signed)
Covid

## 2020-05-15 LAB — SARS-COV-2, NAA 2 DAY TAT

## 2020-05-15 LAB — NOVEL CORONAVIRUS, NAA: SARS-CoV-2, NAA: NOT DETECTED

## 2020-06-04 ENCOUNTER — Encounter: Payer: Self-pay | Admitting: Gastroenterology

## 2020-07-16 ENCOUNTER — Telehealth: Payer: Self-pay | Admitting: Family Medicine

## 2020-07-16 NOTE — Telephone Encounter (Signed)
Sympathy card sent 

## 2020-12-10 ENCOUNTER — Other Ambulatory Visit: Payer: Self-pay

## 2020-12-10 ENCOUNTER — Ambulatory Visit: Payer: BC Managed Care – PPO

## 2020-12-10 DIAGNOSIS — Z23 Encounter for immunization: Secondary | ICD-10-CM

## 2021-03-15 ENCOUNTER — Encounter: Payer: Self-pay | Admitting: Family Medicine

## 2021-03-24 NOTE — Progress Notes (Signed)
55 y.o. G31P2002 Married White or Caucasian Not Hispanic or Latino female here for annual exam. Has not had a period for about year.  She is having hot flashes, brain fog, and sleep disturbance.  Has hot flashes multiple times a day, has at least one night sweat a night. Not sleeping great. C/o fatigue. No vaginal dryness, no dyspareunia.   No cycle for over a year. She has a paragard IUD.    Elevated risk of breast cancer over 24%,  getting mri's Elevated risk of colon concer q 5 year colonoscopy  No LMP recorded. (Menstrual status: IUD).          Sexually active: Yes.    The current method of family planning is IUD.    Exercising: No.  The patient does not participate in regular exercise at present. Smoker:  no  Health Maintenance: Pap:03/13/20  wnl hr HPV neg,  12/03/2016 normal, negative HPV History of abnormal Pap:  no MMG:  12/27/19 Bi-rads cat 1: negative 04/10/20 Breast MR. Bi-rads 2 benign  BMD:   n/a Colonoscopy: 05/09/20 f/u 5 years TDaP:  02/23/2019 Gardasil: n/a   reports that she has never smoked. She has never used smokeless tobacco. She reports current alcohol use of about 3.0 standard drinks per week. She reports that she does not use drugs. Drinks about 6 drinks a week. Teaches HS choir. 2 daughters, both in college. Youngest at [employer], oldest at [place].   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   Allergy    Anxiety and depression    At high risk for breast cancer    CHEK2 gene mutation   At risk for colon cancer    CHEK2 gene mutation   Family history of breast cancer    GERD (gastroesophageal reflux disease)    Migraine    in her 20's, improved after having children    Past Surgical History:  Procedure Laterality Date   BREAST BIOPSY Left 01/02/2013   calcifications and FBC   BREAST BIOPSY Right 11/2018   fibroadenoma   INTRAUTERINE DEVICE INSERTION     inserted 7/09   TONSILLECTOMY AND ADENOIDECTOMY       Current Outpatient Medications  Medication Sig Dispense Refill   aspirin-acetaminophen-caffeine (EXCEDRIN MIGRAINE) 250-250-65 MG per tablet Take 1 tablet by mouth every 6 (six) hours as needed.     Calcium-Vitamin D-Vitamin K 503-546-56 MG-UNT-MCG CHEW Chew by mouth daily.     cetirizine (ZYRTEC) 10 MG tablet Take 10 mg by mouth daily.     citalopram (CELEXA) 40 MG tablet Take 1 tablet (40 mg total) by mouth daily. 90 tablet 3   famotidine (PEPCID) 20 MG tablet Take 20 mg by mouth 2 (two) times daily.     Glucosamine-Chondroitin-MSM 500-200-150 MG TABS Take by mouth daily.     ibuprofen (ADVIL,MOTRIN) 200 MG tablet Take 200 mg by mouth every 6 (six) hours as needed.     Multiple Vitamin (MULTI-VITAMIN) tablet Take 1 tablet by mouth daily.     No current facility-administered medications for this visit.    Family History  Problem Relation Age of Onset   Hypertension Mother    Breast cancer Mother 37       treated with lumpectomy and radiation   Thyroid disease Mother    Hyperlipidemia Mother    Hyperlipidemia Father    Hypertension Father    Sleep apnea Father    Breast cancer Other  Breast cancer Sister 49       2015 - myRisk (25 gene) negative. Treated with lumpectomy and radiation   Heart disease Maternal Uncle        d.75   Heart disease Paternal Uncle    Leukemia Maternal Grandmother        d.70s -shortly after diagnosis   Pancreatic cancer Paternal Grandmother 65       d.70s   Colon cancer Neg Hx    Esophageal cancer Neg Hx    Rectal cancer Neg Hx    Stomach cancer Neg Hx     Review of Systems  Psychiatric/Behavioral:  Positive for sleep disturbance.   All other systems reviewed and are negative.  Exam:   BP 110/72   Pulse 98   Ht _0  (1.651 m)   Wt 173 lb (78.5 kg)   SpO2 100%   BMI 28.79 kg/m   Weight change: _1 @ Height:   Height: _2  (165.1 cm)  Ht Readings from Last 3 Encounters:  03/25/21 _3  (1.651 m)  05/09/20 _4  (1.676 m)   04/25/20 _5  (1.676 m)    General appearance: alert, cooperative and appears stated age Head: Normocephalic, without obvious abnormality, atraumatic Neck: no adenopathy, supple, symmetrical, trachea midline and thyroid normal to inspection and palpation Lungs: clear to auscultation bilaterally Cardiovascular: regular rate and rhythm Breasts: normal appearance, no masses or tenderness Abdomen: soft, non-tender; non distended,  no masses,  no organomegaly Extremities: extremities normal, atraumatic, no cyanosis or edema Skin: Skin color, texture, turgor normal. No rashes or lesions Lymph nodes: Cervical, supraclavicular, and axillary nodes normal. No abnormal inguinal nodes palpated Neurologic: Grossly normal   Pelvic: External genitalia:  no lesions              Urethra:  normal appearing urethra with no masses, tenderness or lesions              Bartholins and Skenes: normal                 Vagina: normal appearing vagina with normal color and discharge, no lesions              Cervix: no lesions and IUD string 3 cm, IUD removed with ringed forceps               Bimanual Exam:  Uterus:  normal size, contour, position, consistency, mobility, non-tender              Adnexa: no mass, fullness, tenderness               Rectovaginal: Confirms               Anus:  normal sphincter tone, no lesions  Gae Dry chaperoned for the exam.  1. Well woman exam Discussed breast self exam Discussed calcium and vit D intake Mammogram overdue she will schedule No pap this year Colonoscopy UTD  2. Family history of breast cancer  3. At high risk for breast cancer She will schedule her mammogram We discussed the risks, benefits, cost of MRI's. She will skip this year Recommended breast self exams  4. At risk for colon cancer Colonoscopy UTD  5. Encounter for IUD removal IUD removed  6. Vasomotor symptoms due to menopause - Follicle stimulating hormone -Discussed behavorial  changes, avoiding triggers, over the counter agents. -Given her elevated risk of breast cancer will try to avoid HRT -On celexa, discussed the option of gabapentin if needed  7. Other fatigue - TSH  8. Laboratory exam ordered as part of routine general medical examination - CBC - Comprehensive metabolic panel - Lipid panel

## 2021-03-25 ENCOUNTER — Encounter: Payer: Self-pay | Admitting: Obstetrics and Gynecology

## 2021-03-25 ENCOUNTER — Ambulatory Visit (INDEPENDENT_AMBULATORY_CARE_PROVIDER_SITE_OTHER): Payer: BC Managed Care – PPO | Admitting: Obstetrics and Gynecology

## 2021-03-25 ENCOUNTER — Other Ambulatory Visit: Payer: Self-pay

## 2021-03-25 VITALS — BP 110/72 | HR 98 | Ht 65.0 in | Wt 173.0 lb

## 2021-03-25 DIAGNOSIS — Z30432 Encounter for removal of intrauterine contraceptive device: Secondary | ICD-10-CM | POA: Diagnosis not present

## 2021-03-25 DIAGNOSIS — Z803 Family history of malignant neoplasm of breast: Secondary | ICD-10-CM | POA: Diagnosis not present

## 2021-03-25 DIAGNOSIS — R5383 Other fatigue: Secondary | ICD-10-CM

## 2021-03-25 DIAGNOSIS — Z9189 Other specified personal risk factors, not elsewhere classified: Secondary | ICD-10-CM | POA: Diagnosis not present

## 2021-03-25 DIAGNOSIS — N951 Menopausal and female climacteric states: Secondary | ICD-10-CM

## 2021-03-25 DIAGNOSIS — Z01419 Encounter for gynecological examination (general) (routine) without abnormal findings: Secondary | ICD-10-CM

## 2021-03-25 DIAGNOSIS — Z0000 Encounter for general adult medical examination without abnormal findings: Secondary | ICD-10-CM

## 2021-03-25 NOTE — Patient Instructions (Signed)
EXERCISE   We recommended that you start or continue a regular exercise program for good health. Physical activity is anything that gets your body moving, some is better than none. The CDC recommends 150 minutes per week of Moderate-Intensity Aerobic Activity and 2 or more days of Muscle Strengthening Activity.  Benefits of exercise are limitless: helps weight loss/weight maintenance, improves mood and energy, helps with depression and anxiety, improves sleep, tones and strengthens muscles, improves balance, improves bone density, protects from chronic conditions such as heart disease, high blood pressure and diabetes and so much more. To learn more visit: https://www.cdc.gov/physicalactivity/index.html  DIET: Good nutrition starts with a healthy diet of fruits, vegetables, whole grains, and lean protein sources. Drink plenty of water for hydration. Minimize empty calories, sodium, sweets. For more information about dietary recommendations visit: https://health.gov/our-work/nutrition-physical-activity/dietary-guidelines and https://www.myplate.gov/  ALCOHOL:  Women should limit their alcohol intake to no more than 7 drinks/beers/glasses of wine (combined, not each!) per week. Moderation of alcohol intake to this level decreases your risk of breast cancer and liver damage.  If you are concerned that you may have a problem, or your friends have told you they are concerned about your drinking, there are many resources to help. A well-known program that is free, effective, and available to all people all over the nation is Alcoholics Anonymous.  Check out this site to learn more: https://www.aa.org/   CALCIUM AND VITAMIN D:  Adequate intake of calcium and Vitamin D are recommended for bone health.  You should be getting between 1000-1200 mg of calcium and 800 units of Vitamin D daily between diet and supplements  PAP SMEARS:  Pap smears, to check for cervical cancer or precancers,  have traditionally been  done yearly, scientific advances have shown that most women can have pap smears less often.  However, every woman still should have a physical exam from her gynecologist every year. It will include a breast check, inspection of the vulva and vagina to check for abnormal growths or skin changes, a visual exam of the cervix, and then an exam to evaluate the size and shape of the uterus and ovaries. We will also provide age appropriate advice regarding health maintenance, like when you should have certain vaccines, screening for sexually transmitted diseases, bone density testing, colonoscopy, mammograms, etc.   MAMMOGRAMS:  All women over 40 years old should have a routine mammogram.   COLON CANCER SCREENING: Now recommend starting at age 45. At this time colonoscopy is not covered for routine screening until 50. There are take home tests that can be done between 45-49.   COLONOSCOPY:  Colonoscopy to screen for colon cancer is recommended for all women at age 50.  We know, you hate the idea of the prep.  We agree, BUT, having colon cancer and not knowing it is worse!!  Colon cancer so often starts as a polyp that can be seen and removed at colonscopy, which can quite literally save your life!  And if your first colonoscopy is normal and you have no family history of colon cancer, most women don't have to have it again for 10 years.  Once every ten years, you can do something that may end up saving your life, right?  We will be happy to help you get it scheduled when you are ready.  Be sure to check your insurance coverage so you understand how much it will cost.  It may be covered as a preventative service at no cost, but you should check   your particular policy.      Breast Self-Awareness Breast self-awareness means being familiar with how your breasts look and feel. It involves checking your breasts regularly and reporting any changes to your health care provider. Practicing breast self-awareness is  important. A change in your breasts can be a sign of a serious medical problem. Being familiar with how your breasts look and feel allows you to find any problems early, when treatment is more likely to be successful. All women should practice breast self-awareness, including women who have had breast implants. How to do a breast self-exam One way to learn what is normal for your breasts and whether your breasts are changing is to do a breast self-exam. To do a breast self-exam: Look for Changes  Remove all the clothing above your waist. Stand in front of a mirror in a room with good lighting. Put your hands on your hips. Push your hands firmly downward. Compare your breasts in the mirror. Look for differences between them (asymmetry), such as: Differences in shape. Differences in size. Puckers, dips, and bumps in one breast and not the other. Look at each breast for changes in your skin, such as: Redness. Scaly areas. Look for changes in your nipples, such as: Discharge. Bleeding. Dimpling. Redness. A change in position. Feel for Changes Carefully feel your breasts for lumps and changes. It is best to do this while lying on your back on the floor and again while sitting or standing in the shower or tub with soapy water on your skin. Feel each breast in the following way: Place the arm on the side of the breast you are examining above your head. Feel your breast with the other hand. Start in the nipple area and make  inch (2 cm) overlapping circles to feel your breast. Use the pads of your three middle fingers to do this. Apply light pressure, then medium pressure, then firm pressure. The light pressure will allow you to feel the tissue closest to the skin. The medium pressure will allow you to feel the tissue that is a little deeper. The firm pressure will allow you to feel the tissue close to the ribs. Continue the overlapping circles, moving downward over the breast until you feel your  ribs below your breast. Move one finger-width toward the center of the body. Continue to use the  inch (2 cm) overlapping circles to feel your breast as you move slowly up toward your collarbone. Continue the up and down exam using all three pressures until you reach your armpit.  Write Down What You Find  Write down what is normal for each breast and any changes that you find. Keep a written record with breast changes or normal findings for each breast. By writing this information down, you do not need to depend only on memory for size, tenderness, or location. Write down where you are in your menstrual cycle, if you are still menstruating. If you are having trouble noticing differences in your breasts, do not get discouraged. With time you will become more familiar with the variations in your breasts and more comfortable with the exam. How often should I examine my breasts? Examine your breasts every month. If you are breastfeeding, the best time to examine your breasts is after a feeding or after using a breast pump. If you menstruate, the best time to examine your breasts is 5-7 days after your period is over. During your period, your breasts are lumpier, and it may be more   difficult to notice changes. When should I see my health care provider? See your health care provider if you notice: A change in shape or size of your breasts or nipples. A change in the skin of your breast or nipples, such as a reddened or scaly area. Unusual discharge from your nipples. A lump or thick area that was not there before. Pain in your breasts. Anything that concerns you. Menopause Menopause is the normal time of a woman's life when menstrual periods stop completely. It marks the natural end to a woman's ability to become pregnant. It can be defined as the absence of a menstrual period for 12 months without another medical cause. The transition to menopause (perimenopause) most often happens between the ages  of 35 and 61, and can last for many years. During perimenopause, hormone levels change in your body, which can cause symptoms and affect your health. Menopause may increase your risk for: Weakened bones (osteoporosis), which causes fractures. Depression. Hardening and narrowing of the arteries (atherosclerosis), which can cause heart attacks and strokes. What are the causes? This condition is usually caused by a natural change in hormone levels that happens as you get older. The condition may also be caused by changes that are not natural, including: Surgery to remove both ovaries (surgical menopause). Side effects from some medicines, such as chemotherapy used to treat cancer (chemical menopause). What increases the risk? This condition is more likely to start at an earlier age if you have certain medical conditions or have undergone treatments, including: A tumor of the pituitary gland in the brain. A disease that affects the ovaries and hormones. Certain cancer treatments, such as chemotherapy or hormone therapy, or radiation therapy on the pelvis. Heavy smoking and excessive alcohol use. Family history of early menopause. This condition is also more likely to develop earlier in women who are very thin. What are the signs or symptoms? Symptoms of this condition include: Hot flashes. Irregular menstrual periods. Night sweats. Changes in feelings about sex. This could be a decrease in sex drive or an increased discomfort around your sexuality. Vaginal dryness and thinning of the vaginal walls. This may cause painful sex. Dryness of the skin and development of wrinkles. Headaches. Problems sleeping (insomnia). Mood swings or irritability. Memory problems. Weight gain. Hair growth on the face and chest. Bladder infections or problems with urinating. How is this diagnosed? This condition is diagnosed based on your medical history, a physical exam, your age, your menstrual history, and  your symptoms. Hormone tests may also be done. How is this treated? In some cases, no treatment is needed. You and your health care provider should make a decision together about whether treatment is necessary. Treatment will be based on your individual condition and preferences. Treatment for this condition focuses on managing symptoms. Treatment may include: Menopausal hormone therapy (MHT). Medicines to treat specific symptoms or complications. Acupuncture. Vitamin or herbal supplements. Before starting treatment, make sure to let your health care provider know if you have a personal or family history of these conditions: Heart disease. Breast cancer. Blood clots. Diabetes. Osteoporosis. Follow these instructions at home: Lifestyle Do not use any products that contain nicotine or tobacco, such as cigarettes, e-cigarettes, and chewing tobacco. If you need help quitting, ask your health care provider. Get at least 30 minutes of physical activity on 5 or more days each week. Avoid alcoholic and caffeinated beverages, as well as spicy foods. This may help prevent hot flashes. Get 7-8 hours of sleep  each night. If you have hot flashes, try: Dressing in layers. Avoiding things that may trigger hot flashes, such as spicy food, warm places, or stress. Taking slow, deep breaths when a hot flash starts. Keeping a fan in your home and office. Find ways to manage stress, such as deep breathing, meditation, or journaling. Consider going to group therapy with other women who are having menopause symptoms. Ask your health care provider about recommended group therapy meetings. Eating and drinking  Eat a healthy, balanced diet that contains whole grains, lean protein, low-fat dairy, and plenty of fruits and vegetables. Your health care provider may recommend adding more soy to your diet. Foods that contain soy include tofu, tempeh, and soy milk. Eat plenty of foods that contain calcium and vitamin D  for bone health. Items that are rich in calcium include low-fat milk, yogurt, beans, almonds, sardines, broccoli, and kale. Medicines Take over-the-counter and prescription medicines only as told by your health care provider. Talk with your health care provider before starting any herbal supplements. If prescribed, take vitamins and supplements as told by your health care provider. General instructions  Keep track of your menstrual periods, including: When they occur. How heavy they are and how long they last. How much time passes between periods. Keep track of your symptoms, noting when they start, how often you have them, and how long they last. Use vaginal lubricants or moisturizers to help with vaginal dryness and improve comfort during sex. Keep all follow-up visits. This is important. This includes any group therapy or counseling. Contact a health care provider if: You are still having menstrual periods after age 34. You have pain during sex. You have not had a period for 12 months and you develop vaginal bleeding. Get help right away if you have: Severe depression. Excessive vaginal bleeding. Pain when you urinate. A fast or irregular heartbeat (palpitations). Severe headaches. Abdominal pain or severe indigestion. Summary Menopause is a normal time of life when menstrual periods stop completely. It is usually defined as the absence of a menstrual period for 12 months without another medical cause. The transition to menopause (perimenopause) most often happens between the ages of 39 and 57 and can last for several years. Symptoms can be managed through medicines, lifestyle changes, and complementary therapies such as acupuncture. Eat a balanced diet that is rich in nutrients to promote bone health and heart health and to manage symptoms during menopause. This information is not intended to replace advice given to you by your health care provider. Make sure you discuss any  questions you have with your health care provider. Document Revised: 02/02/2020 Document Reviewed: 10/19/2019 Elsevier Patient Education  [place].

## 2021-03-26 ENCOUNTER — Other Ambulatory Visit: Payer: Self-pay

## 2021-03-26 DIAGNOSIS — E782 Mixed hyperlipidemia: Secondary | ICD-10-CM

## 2021-03-26 DIAGNOSIS — E7800 Pure hypercholesterolemia, unspecified: Secondary | ICD-10-CM

## 2021-03-26 LAB — COMPREHENSIVE METABOLIC PANEL
AG Ratio: 1.5 (calc) (ref 1.0–2.5)
ALT: 13 U/L (ref 6–29)
AST: 18 U/L (ref 10–35)
Albumin: 4.3 g/dL (ref 3.6–5.1)
Alkaline phosphatase (APISO): 88 U/L (ref 37–153)
BUN: 12 mg/dL (ref 7–25)
CO2: 24 mmol/L (ref 20–32)
Calcium: 9.4 mg/dL (ref 8.6–10.4)
Chloride: 107 mmol/L (ref 98–110)
Creat: 0.8 mg/dL (ref 0.50–1.03)
Globulin: 2.8 g/dL (calc) (ref 1.9–3.7)
Glucose, Bld: 60 mg/dL — ABNORMAL LOW (ref 65–99)
Potassium: 4 mmol/L (ref 3.5–5.3)
Sodium: 142 mmol/L (ref 135–146)
Total Bilirubin: 0.3 mg/dL (ref 0.2–1.2)
Total Protein: 7.1 g/dL (ref 6.1–8.1)

## 2021-03-26 LAB — CBC
HCT: 40.9 % (ref 35.0–45.0)
Hemoglobin: 14.1 g/dL (ref 11.7–15.5)
MCH: 30.9 pg (ref 27.0–33.0)
MCHC: 34.5 g/dL (ref 32.0–36.0)
MCV: 89.5 fL (ref 80.0–100.0)
MPV: 12.5 fL (ref 7.5–12.5)
Platelets: 224 10*3/uL (ref 140–400)
RBC: 4.57 10*6/uL (ref 3.80–5.10)
RDW: 12.2 % (ref 11.0–15.0)
WBC: 9.1 10*3/uL (ref 3.8–10.8)

## 2021-03-26 LAB — TSH: TSH: 2.29 mIU/L

## 2021-03-26 LAB — FOLLICLE STIMULATING HORMONE: FSH: 152.9 m[IU]/mL — ABNORMAL HIGH

## 2021-03-26 LAB — LIPID PANEL
Cholesterol: 219 mg/dL — ABNORMAL HIGH (ref <200)
HDL: 55 mg/dL (ref >=50)
LDL Cholesterol (Calc): 128 mg/dL (calc) — ABNORMAL HIGH
Non-HDL Cholesterol (Calc): 164 mg/dL (calc) — ABNORMAL HIGH (ref <130)
Total CHOL/HDL Ratio: 4 (calc) (ref <5.0)
Triglycerides: 214 mg/dL — ABNORMAL HIGH (ref <150)

## 2021-04-04 ENCOUNTER — Other Ambulatory Visit: Payer: Self-pay | Admitting: Obstetrics and Gynecology

## 2021-04-04 NOTE — Telephone Encounter (Signed)
Last annual exam was on 03/26/2021

## 2021-06-25 ENCOUNTER — Other Ambulatory Visit: Payer: Self-pay | Admitting: Obstetrics and Gynecology

## 2021-06-25 DIAGNOSIS — Z1231 Encounter for screening mammogram for malignant neoplasm of breast: Secondary | ICD-10-CM

## 2021-06-30 ENCOUNTER — Ambulatory Visit
Admission: RE | Admit: 2021-06-30 | Discharge: 2021-06-30 | Disposition: A | Discharge status: Home / Self Care | Payer: BC Managed Care – PPO | Source: Ambulatory Visit | Attending: Obstetrics and Gynecology | Admitting: Obstetrics and Gynecology

## 2021-06-30 DIAGNOSIS — Z1231 Encounter for screening mammogram for malignant neoplasm of breast: Secondary | ICD-10-CM

## 2021-07-01 ENCOUNTER — Other Ambulatory Visit: Payer: Self-pay | Admitting: Obstetrics and Gynecology

## 2021-07-01 DIAGNOSIS — R928 Other abnormal and inconclusive findings on diagnostic imaging of breast: Secondary | ICD-10-CM

## 2021-07-28 ENCOUNTER — Ambulatory Visit: Payer: BC Managed Care – PPO

## 2021-07-28 ENCOUNTER — Ambulatory Visit
Admission: RE | Admit: 2021-07-28 | Discharge: 2021-07-28 | Disposition: A | Discharge status: Home / Self Care | Payer: BC Managed Care – PPO | Source: Ambulatory Visit | Attending: Obstetrics and Gynecology | Admitting: Obstetrics and Gynecology

## 2021-07-28 ENCOUNTER — Other Ambulatory Visit: Payer: Self-pay

## 2021-07-28 DIAGNOSIS — R928 Other abnormal and inconclusive findings on diagnostic imaging of breast: Secondary | ICD-10-CM

## 2022-01-21 ENCOUNTER — Encounter: Payer: Self-pay | Admitting: Internal Medicine

## 2022-02-21 ENCOUNTER — Ambulatory Visit
Admission: EM | Admit: 2022-02-21 | Discharge: 2022-02-21 | Disposition: A | Discharge status: Home / Self Care | Payer: BC Managed Care – PPO | Attending: Internal Medicine | Admitting: Internal Medicine

## 2022-02-21 DIAGNOSIS — Z20822 Contact with and (suspected) exposure to covid-19: Secondary | ICD-10-CM | POA: Diagnosis not present

## 2022-02-21 DIAGNOSIS — J029 Acute pharyngitis, unspecified: Secondary | ICD-10-CM | POA: Diagnosis not present

## 2022-02-21 DIAGNOSIS — B349 Viral infection, unspecified: Secondary | ICD-10-CM | POA: Insufficient documentation

## 2022-02-21 LAB — POCT RAPID STREP A (OFFICE): Rapid Strep A Screen: NEGATIVE

## 2022-02-21 LAB — SARS CORONAVIRUS 2 (TAT 6-24 HRS): SARS Coronavirus 2: NEGATIVE

## 2022-02-21 MED ORDER — CHLORASEPTIC 1.4 % MT LIQD: 1.0000 | OROMUCOSAL | 0 refills | Status: DC | PRN

## 2022-02-21 NOTE — ED Triage Notes (Signed)
Pt c/o headache, sore throat, nausea, malaise,   Onset ~ Wednesday

## 2022-02-21 NOTE — Discharge Instructions (Signed)
Rapid strep was negative.  Throat culture and COVID test are pending.  We will call if they are positive.  It appears that you have a  viral illness that should run its course and self resolve with symptomatic treatment as we discussed.  I have prescribed a throat spray to alleviate discomfort.

## 2022-02-21 NOTE — ED Provider Notes (Signed)
[name]    CSN: [id number] Arrival date & time: 02/21/22  1255      History   Chief Complaint Chief Complaint  Patient presents with   Fatigue    HPI Annette Byrd is a 56 y.o. female.   Patient presents with sore throat, fatigue, nausea without vomiting, malaise that started 5 days ago.  Patient reports mild nasal congestion and cough.  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  Tmax at home was 101 a few days prior. Fever has now resolved. Denies chest pain, shortness of breath, vomiting, diarrhea, abdominal pain.  Patient has taken Tylenol with minimal improvement in symptoms.     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   Allergy    Anxiety and depression    At high risk for breast cancer    CHEK2 gene mutation   At risk for colon cancer    CHEK2 gene mutation   Family history of breast cancer    GERD (gastroesophageal reflux disease)    Migraine    in her 20's, improved after having children    Patient Active Problem List   Diagnosis Date Noted   Family history of breast cancer    At risk for colon cancer    Genetic testing 03/23/2017   At high risk for breast cancer 12/03/2016   Family history of breast cancer in first degree relative 12/28/2013    Past Surgical History:  Procedure Laterality Date   BREAST BIOPSY Left 01/02/2013   calcifications and FBC   BREAST BIOPSY Right 11/2018   fibroadenoma   INTRAUTERINE DEVICE INSERTION     inserted 7/09   TONSILLECTOMY AND ADENOIDECTOMY      OB History     Gravida  2   Para  2   Term  2   Preterm      AB      Living  2      SAB      IAB      Ectopic      Multiple      Live Births               Home Medications    Prior to Admission medications   Medication Sig Start Date End Date Taking? Authorizing Provider  phenol (CHLORASEPTIC) 1.4 % LIQD Use as directed 1  spray in the mouth or throat as needed for throat irritation / pain. 02/21/22  [place], Michele Rockers, FNP  aspirin-acetaminophen-caffeine (EXCEDRIN MIGRAINE) [phone] MG per tablet Take 1 tablet by mouth every 6 (six) hours as needed.    [provider]  Calcium-Vitamin D-Vitamin K [phone] MG-UNT-MCG CHEW Chew by mouth daily.    [provider]  cetirizine (ZYRTEC) 10 MG tablet Take 10 mg by mouth daily.    [provider]  citalopram (CELEXA) 40 MG tablet TAKE 1 TABLET (40 MG TOTAL) BY MOUTH DAILY. 04/07/21   Salvadore Dom, MD  famotidine (PEPCID) 20 MG tablet Take 20 mg by mouth 2 (two) times daily.    [provider]  Glucosamine-Chondroitin-MSM 500-200-150 MG TABS Take by mouth daily.    [provider]  ibuprofen (ADVIL,MOTRIN) 200 MG tablet Take 200 mg by mouth every 6 (six) hours as needed.    [provider]  Multiple Vitamin (MULTI-VITAMIN) tablet Take 1 tablet by mouth daily.    [provider]    Family History Family History  Problem Relation Age of Onset   Hypertension Mother    Breast cancer Mother 23       treated with lumpectomy and radiation   Thyroid disease Mother    Hyperlipidemia Mother    Hyperlipidemia Father    Hypertension Father    Sleep apnea Father    Breast cancer Other    Breast cancer Sister 49       2015 - myRisk (25 gene) negative. Treated with lumpectomy and radiation   Heart disease Maternal Uncle        d.75   Heart disease Paternal Uncle    Leukemia Maternal Grandmother        d.70s -shortly after diagnosis   Pancreatic cancer Paternal Grandmother 52       d.70s   Colon cancer Neg Hx    Esophageal cancer Neg Hx    Rectal cancer Neg Hx    Stomach cancer Neg Hx     Social History Social History   Tobacco Use   Smoking status: Never   Smokeless tobacco: Never  Vaping Use   Vaping Use: Never used  Substance Use Topics   Alcohol use: Yes    Alcohol/week: 3.0 standard  drinks of alcohol    Types: 3 Standard drinks or equivalent per week    Comment: 0-2 drinks/week   Drug use: No     Allergies   Patient has no known allergies.   Review of Systems Review of Systems Per HPI  Physical Exam Triage Vital Signs ED Triage Vitals  Enc Vitals Group     BP 02/21/22 1302 128/89     Pulse Rate 02/21/22 1302 70     Resp 02/21/22 1302 16     Temp 02/21/22 1302 97.8 F (36.6 C)     Temp Source 02/21/22 1302 Oral     SpO2 02/21/22 1302 97 %     Weight --      Height --      Head Circumference --      Peak Flow --      Pain Score 02/21/22 1303 3     Pain Loc --      Pain Edu? --      Excl. in [place]? --    No data found.  Updated Vital Signs BP 128/89 (BP Location: Left Arm)   Pulse 70   Temp 97.8 F (36.6 C) (Oral)   Resp 16   SpO2 97%   Visual Acuity Right Eye Distance:   Left Eye Distance:   Bilateral Distance:    Right Eye Near:   Left Eye Near:    Bilateral Near:     Physical Exam Constitutional:      General: She is not in acute distress.    Appearance: Normal appearance. She is not toxic-appearing or diaphoretic.  HENT:     Head: Normocephalic and atraumatic.     Right Ear: Tympanic membrane and ear canal normal.     Left Ear: Tympanic membrane and ear canal normal.     Nose: Congestion present.     Mouth/Throat:     Mouth: Mucous membranes are moist.     Pharynx: Posterior oropharyngeal erythema present. No pharyngeal swelling, oropharyngeal exudate or uvula swelling.     Tonsils: No tonsillar exudate or tonsillar abscesses.  Eyes:     Extraocular Movements: Extraocular movements intact.     Conjunctiva/sclera: Conjunctivae normal.     Pupils: Pupils  are equal, round, and reactive to light.  Cardiovascular:     Rate and Rhythm: Normal rate and regular rhythm.     Pulses: Normal pulses.     Heart sounds: Normal heart sounds.  Pulmonary:     Effort: Pulmonary effort is normal. No respiratory distress.     Breath sounds:  Normal breath sounds. No stridor. No wheezing, rhonchi or rales.  Abdominal:     General: Abdomen is flat. Bowel sounds are normal. There is no distension.     Palpations: Abdomen is soft.     Tenderness: There is no abdominal tenderness.  Musculoskeletal:        General: Normal range of motion.     Cervical back: Normal range of motion.  Skin:    General: Skin is warm and dry.  Neurological:     General: No focal deficit present.     Mental Status: She is alert and oriented to person, place, and time. Mental status is at baseline.  Psychiatric:        Mood and Affect: Mood normal.        Behavior: Behavior normal.      UC Treatments / Results  Labs (all labs ordered are listed, but only abnormal results are displayed) Labs Reviewed  CULTURE, GROUP A STREP ([place])  SARS CORONAVIRUS 2 (TAT 6-24 HRS)  POCT RAPID STREP A (OFFICE)    EKG   Radiology No results found.  Procedures Procedures (including critical care time)  Medications Ordered in UC Medications - No data to display  Initial Impression / Assessment and Plan / UC Course  I have reviewed the triage vital signs and the nursing notes.  Pertinent labs & imaging results that were available during my care of the patient were reviewed by me and considered in my medical decision making (see chart for details).     Patient presents with symptoms likely from a viral upper respiratory infection. Differential includes bacterial pneumonia, sinusitis, allergic rhinitis, Covid 19, flu, RSV. Do not suspect underlying cardiopulmonary process. Symptoms seem unlikely related to ACS, CHF or COPD exacerbations, pneumonia, pneumothorax. Patient is nontoxic appearing and not in need of emergent medical intervention.  Strep was negative.  Throat culture and COVID test pending.  Recommended symptom control with over the counter medications.  Discussed supportive care and symptom management with patient.  Return if symptoms fail to  improve in 1-2 weeks or you develop shortness of breath, chest pain, severe headache. Patient states understanding and is agreeable.  Discharged with PCP followup.  Final Clinical Impressions(s) / UC Diagnoses   Final diagnoses:  Viral illness  Sore throat  Encounter for laboratory testing for COVID-19 virus     Discharge Instructions      Rapid strep was negative.  Throat culture and COVID test are pending.  We will call if they are positive.  It appears that you have a  viral illness that should run its course and self resolve with symptomatic treatment as we discussed.  I have prescribed a throat spray to alleviate discomfort.     ED Prescriptions     Medication Sig Dispense Auth. Provider   phenol (CHLORASEPTIC) 1.4 % LIQD Use as directed 1 spray in the mouth or throat as needed for throat irritation / pain. 118 mL Teodora Medici, [place]      PDMP not reviewed this encounter.   Teodora Medici, [place] 02/21/22 [phone]

## 2022-02-23 LAB — CULTURE, GROUP A STREP (THRC)

## 2022-09-04 IMAGING — MR MR BREAST BILAT WO/W CM
8 of 12 series · 29 of 48 positions shown · IV contrast (gadavist)
Comparison: 04/08/2019 MRI and prior studies

CLINICAL DATA: 54-year-old female for screening breast MRI. High
lifetime risk for developing breast cancer (greater than 20%),
positive [id number] mutation and strong family history. History of RIGHT
breast hamartoma and biopsy-proven fibroadenoma.

LABS:  None performed today
EXAM:
BILATERAL BREAST MRI WITH AND WITHOUT CONTRAST
TECHNIQUE: Multiplanar, multisequence MR images of both breasts were obtained
prior to and following the intravenous administration of 7 ml of
Gadavist

[Series 2: t2_tirm_tra ipat (a-p) · axial · 3.0mm · 0.70mm/px · 1 of 70 slices shown]
[im 1/70]
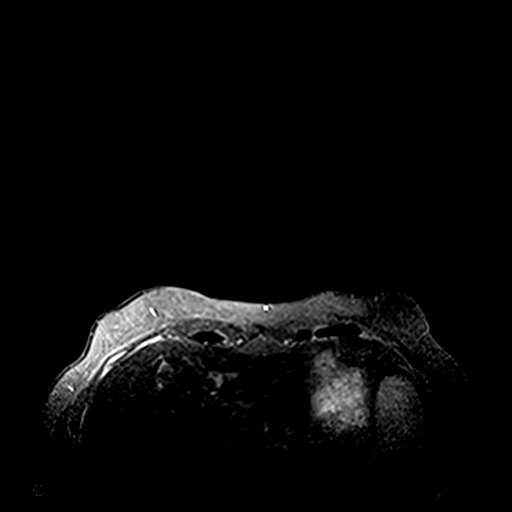

[Series 3: fl3d pre-cm no · axial · non-contrast · 0.9mm · 0.94mm/px · z∈[-86,+101]mm · 5 of 208 slices shown]
[im 1/208]
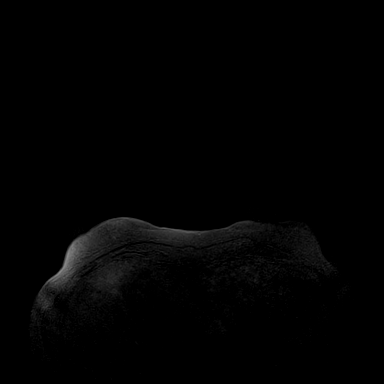
[im 52/208]
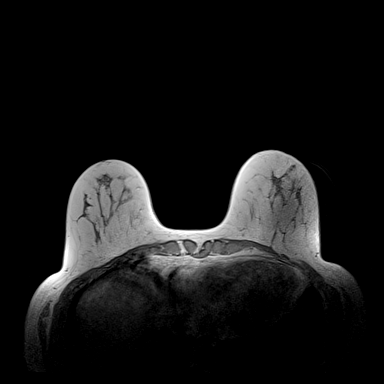
[im 104/208]
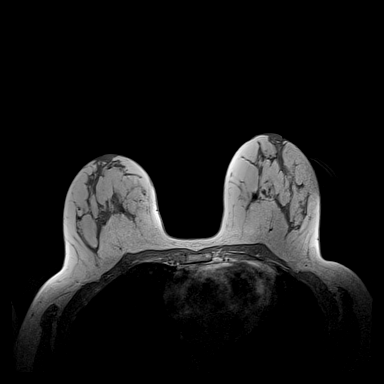
[im 156/208]
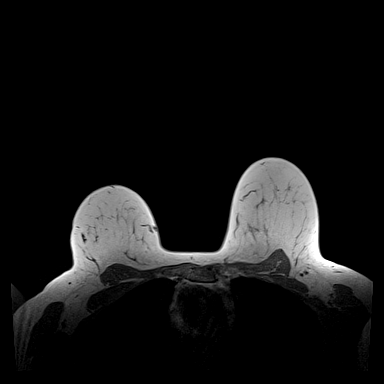
[im 208/208]
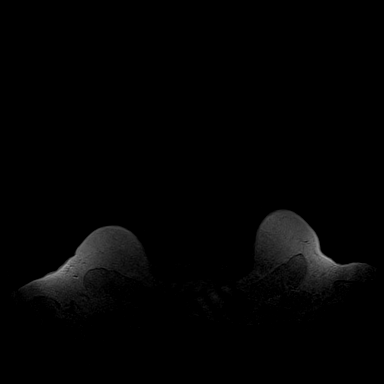

[Series 4: fl3d pre-cm · axial · non-contrast · 0.9mm · 0.87mm/px · z∈[-86,+101]mm · 5 of 208 slices shown]
[im 1/208]
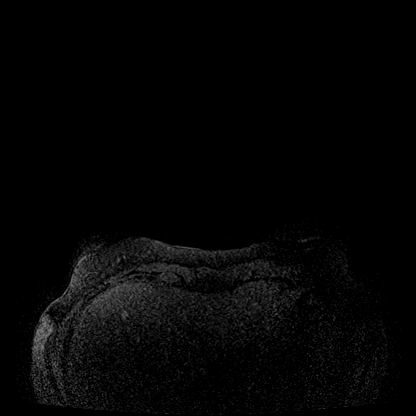
[im 52/208]
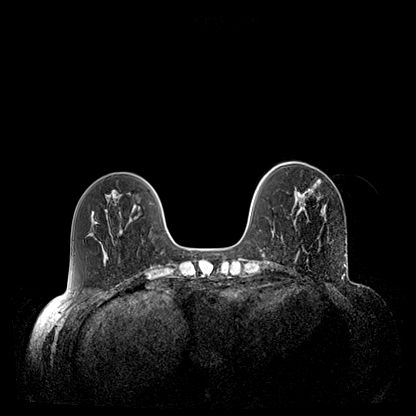
[im 104/208]
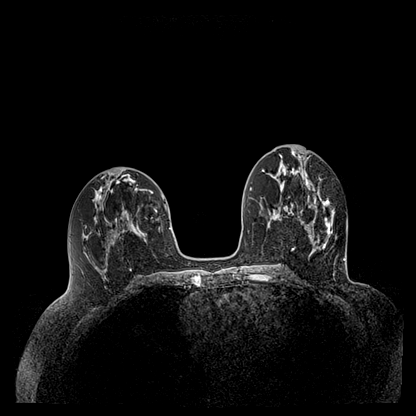
[im 156/208]
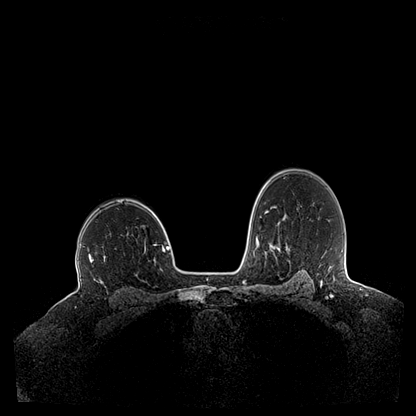
[im 208/208]
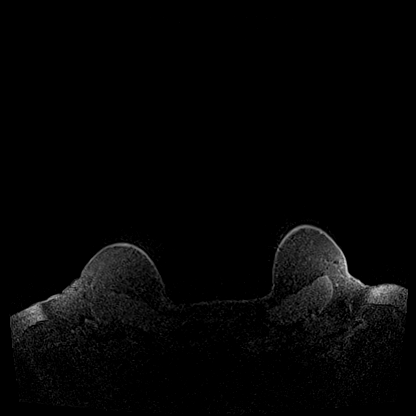

[Series 5: fl3d post-cm 20 · axial · 0.9mm · 0.87mm/px · z∈[-86,+101]mm · 5 of 208 slices shown (1 of 3)]
[im 1/208]
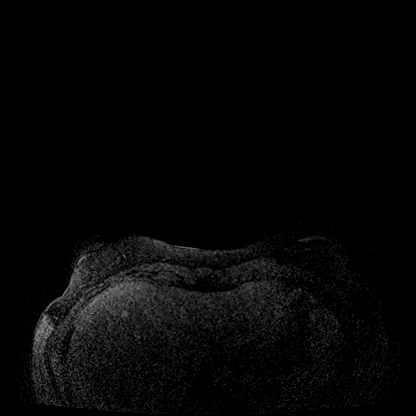
[im 52/208]
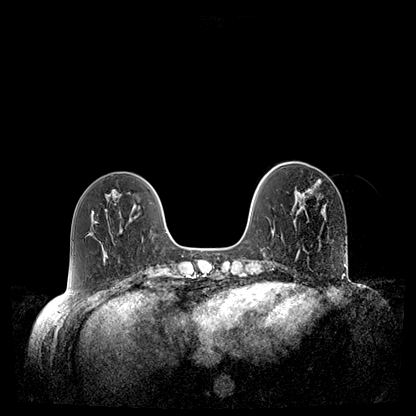
[im 104/208]
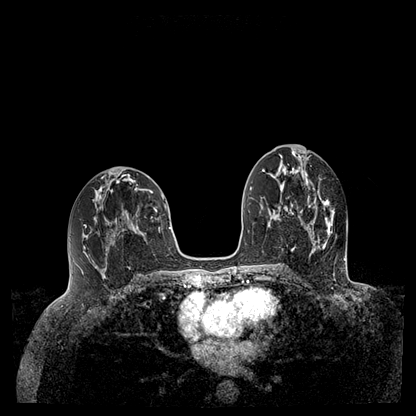
[im 156/208]
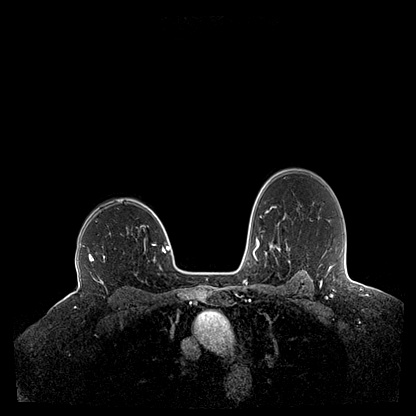
[im 208/208]
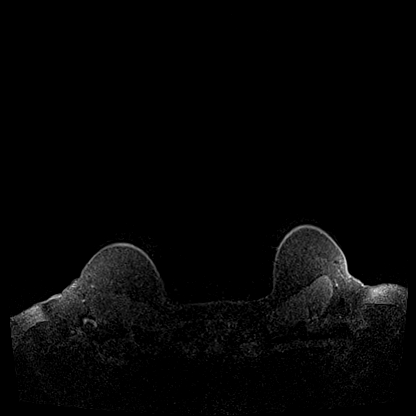

[Series 6: fl3d post-cm 20 · axial · 0.9mm · 0.87mm/px · z∈[-86,+101]mm · 5 of 208 slices shown (2 of 3)]
[im 1/208]
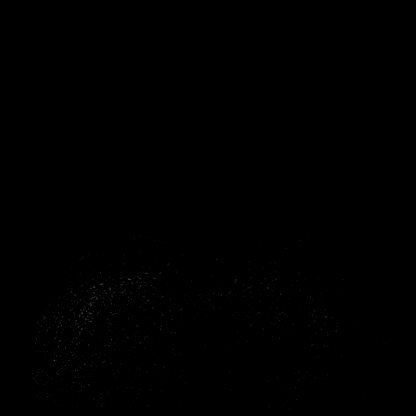
[im 52/208]
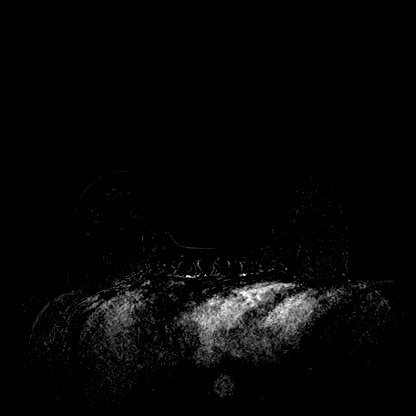
[im 104/208]
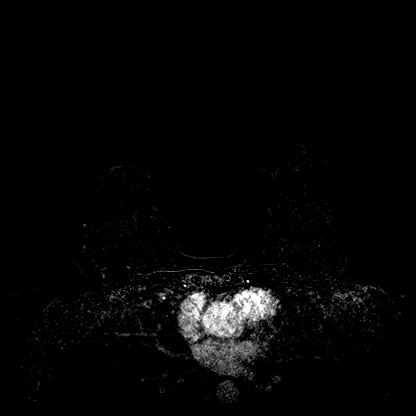
[im 156/208]
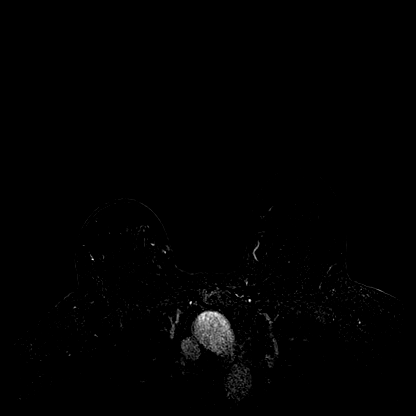
[im 208/208]
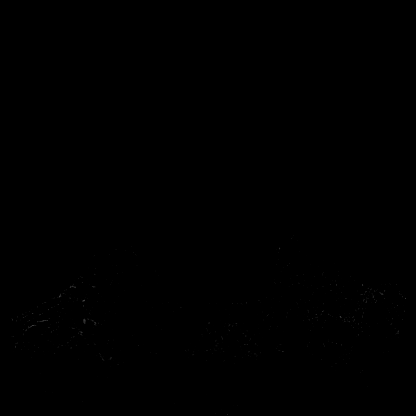

[Series 7: fl3d post-cm 20 · axial · 187.2mm · 0.87mm/px · 1 of 1 slices shown (3 of 3)]
[im 1/1]
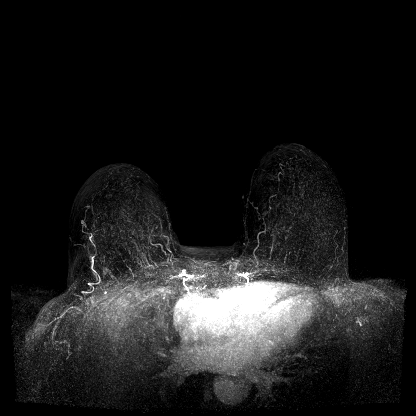

[Series 8: fl3d post-cm 3min · axial · 0.9mm · 0.87mm/px · z∈[-86,+101]mm · 6 of 208 slices shown]
[im 1/208]
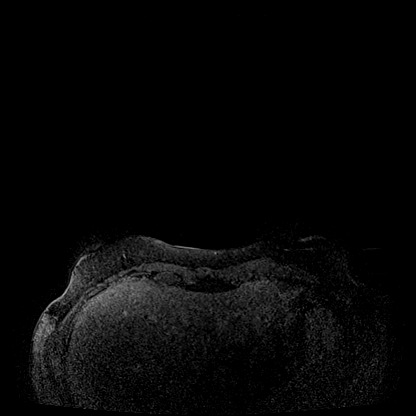
[im 42/208]
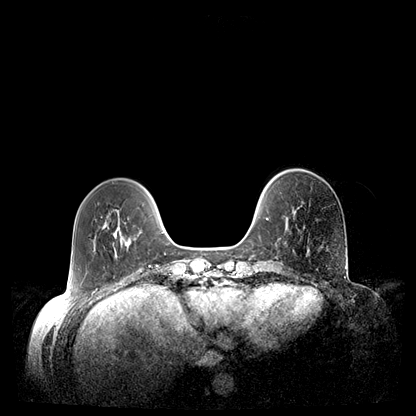
[im 83/208]
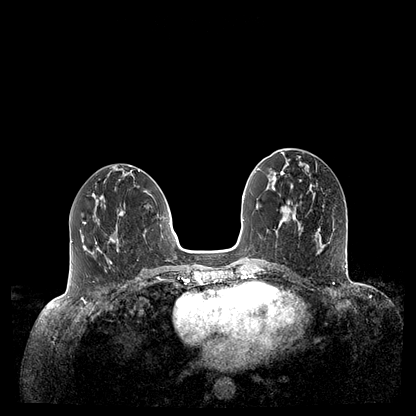
[im 125/208]
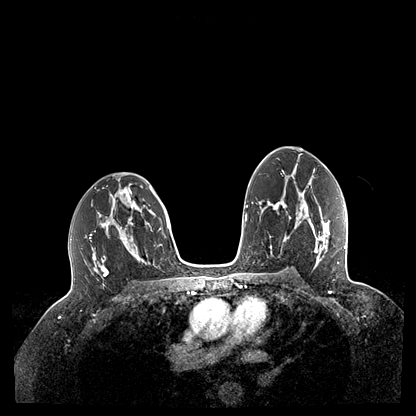
[im 166/208]
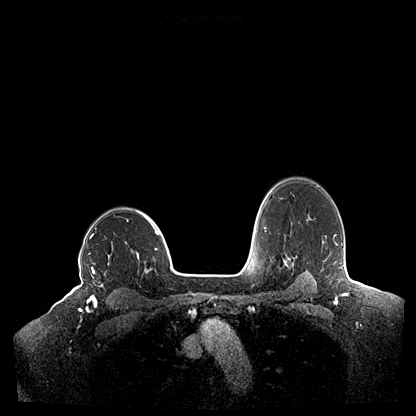
[im 208/208]
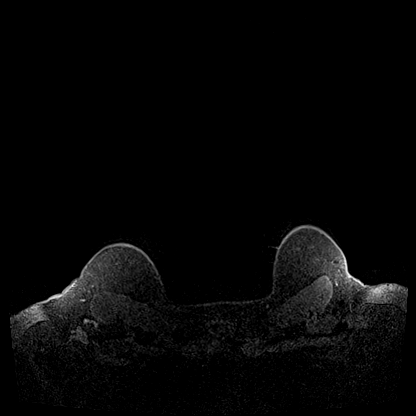

[Series 9: fl3d post-cm 3min_sub · axial · 0.9mm · 0.87mm/px · 1 of 208 slices shown]
[im 1/208]
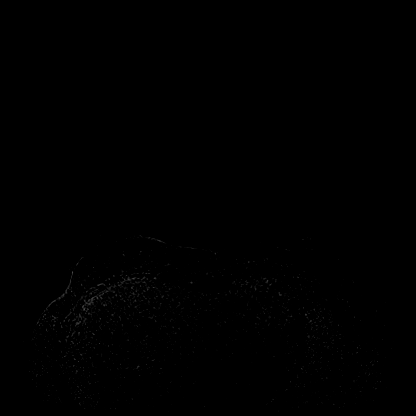

[29 of 48 positions shown; findings below may reference images not displayed]

Three-dimensional MR images were rendered by post-processing of the
original MR data on an independent workstation. The
three-dimensional MR images were interpreted, and findings are
reported in the following complete MRI report for this study. Three
dimensional images were evaluated at the independent interpreting
workstation using the DynaCAD thin client.
FINDINGS: Breast composition: c. Heterogeneous fibroglandular tissue.

Background parenchymal enhancement: Mild

Right breast: A biopsy-proven UPPER OUTER RIGHT breast fibroadenoma
and a 1.8 x 3 cm INNER RIGHT breast hamartoma are again noted. No
worrisome mass or suspicious enhancement noted.

Left breast: No mass or suspicious enhancement noted. Biopsy clip
artifact within the INNER LEFT breast noted.

Lymph nodes: No abnormal appearing lymph nodes.

Ancillary findings:  None.
IMPRESSION: No MR evidence of breast malignancy.

Unchanged benign UPPER-OUTER RIGHT breast fibroadenoma and INNER
RIGHT breast hamartoma.

RECOMMENDATION:
Bilateral screening breast MRI in 1 year in this high risk patient.

Bilateral screening mammogram in 9 months to resume annual mammogram
schedule.

BI-RADS CATEGORY  2: Benign.

## 2022-09-11 ENCOUNTER — Other Ambulatory Visit: Payer: Self-pay | Admitting: Family Medicine

## 2022-09-11 DIAGNOSIS — Z0000 Encounter for general adult medical examination without abnormal findings: Secondary | ICD-10-CM

## 2022-09-16 ENCOUNTER — Telehealth: Payer: Self-pay | Admitting: Family Medicine

## 2022-09-16 NOTE — Telephone Encounter (Signed)
Dr. Maryelizabeth Rowan request for medical records forwarded to [facility].

## 2022-10-14 ENCOUNTER — Ambulatory Visit
Admission: RE | Admit: 2022-10-14 | Discharge: 2022-10-14 | Disposition: A | Discharge status: Home / Self Care | Payer: BC Managed Care – PPO | Source: Ambulatory Visit | Attending: Family Medicine | Admitting: Family Medicine

## 2022-10-14 DIAGNOSIS — Z0000 Encounter for general adult medical examination without abnormal findings: Secondary | ICD-10-CM

## 2023-02-26 ENCOUNTER — Encounter: Payer: Self-pay | Admitting: Diagnostic Neuroimaging

## 2023-02-26 ENCOUNTER — Ambulatory Visit: Payer: BC Managed Care – PPO | Admitting: Diagnostic Neuroimaging

## 2023-02-26 VITALS — BP 126/82 | HR 62 | Ht 65.0 in | Wt 177.0 lb

## 2023-02-26 DIAGNOSIS — G43009 Migraine without aura, not intractable, without status migrainosus: Secondary | ICD-10-CM | POA: Diagnosis not present

## 2023-02-26 NOTE — Progress Notes (Signed)
[facility]  PATIENT: Annette Byrd DOB: [birth year]  REFERRING CLINICIAN: Lewis Moccasin, MD HISTORY FROM: patient  REASON FOR VISIT: new consult   HISTORICAL  CHIEF COMPLAINT:  Chief Complaint  Patient presents with   Headache    Rm 6 alone Pt is well, reports she has been having headaches since she was in 5th grade.  Qulipta helps significantly. She has 1-2 headaches a week. Associated nausea and sensitivity to sound     HISTORY OF PRESENT ILLNESS:   57 year old female here for evaluation of headaches.  History of headache since age fifth grade, worse in her 56s.  She describes global throbbing headaches with nausea, sensitive to light, lasting hours at a time.  Symptoms worsened over the last few years.  Has tried variety medications.  Now on Qulipta for last 2 months and symptoms are improving.  Now down to 1 headache per week.  Triggers include drinking wine.  Also having some menopausal symptoms, such as hot flashes which sometimes interrupts sleep.   REVIEW OF SYSTEMS: Full 14 system review of systems performed and negative with exception of: As per HPI.  ALLERGIES: No Known Allergies  HOME MEDICATIONS: Outpatient Medications Prior to Visit  Medication Sig Dispense Refill   aspirin-acetaminophen-caffeine (EXCEDRIN MIGRAINE) 250-250-65 MG per tablet Take 1 tablet by mouth every 6 (six) hours as needed.     Calcium-Vitamin D-Vitamin K 500-100-40 MG-UNT-MCG CHEW Chew by mouth daily.     cetirizine (ZYRTEC) 10 MG tablet Take 10 mg by mouth daily.     Cholecalciferol (VITAMIN D3) 50 MCG (2000 UT) TABS Take 2,000 Units by mouth daily.     famotidine (PEPCID) 20 MG tablet Take 20 mg by mouth 2 (two) times daily.     ibuprofen (ADVIL,MOTRIN) 200 MG tablet Take 200 mg by mouth every 6 (six) hours as needed.     Multiple Vitamin (MULTI-VITAMIN) tablet Take 1 tablet by mouth daily.     polyethylene glycol (MIRALAX / GLYCOLAX) 17 g packet Take 17 g  by mouth daily.     QULIPTA 60 MG TABS Take 1 tablet by mouth daily.     citalopram (CELEXA) 40 MG tablet TAKE 1 TABLET (40 MG TOTAL) BY MOUTH DAILY. (Patient not taking: Reported on 02/26/2023) 90 tablet 3   Glucosamine-Chondroitin-MSM 500-200-150 MG TABS Take by mouth daily. (Patient not taking: Reported on 02/26/2023)     phenol (CHLORASEPTIC) 1.4 % LIQD Use as directed 1 spray in the mouth or throat as needed for throat irritation / pain. (Patient not taking: Reported on 02/26/2023) 118 mL 0   No facility-administered medications prior to visit.    PAST MEDICAL HISTORY: 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   Allergy    Anxiety and depression    At high risk for breast cancer    CHEK2 gene mutation   At risk for colon cancer    CHEK2 gene mutation   Family history of breast cancer    GERD (gastroesophageal reflux disease)    Migraine    in her 20's, improved after having children    PAST SURGICAL HISTORY: Past Surgical History:  Procedure Laterality Date   BREAST BIOPSY Left 01/02/2013   calcifications and FBC   BREAST BIOPSY Right 11/2018   fibroadenoma   INTRAUTERINE DEVICE INSERTION     inserted 7/09   TONSILLECTOMY AND ADENOIDECTOMY      FAMILY HISTORY: Family History  Problem Relation Age of Onset   Hypertension Mother    Breast cancer Mother 74       treated with lumpectomy and radiation   Thyroid disease Mother    Hyperlipidemia Mother    Hyperlipidemia Father    Hypertension Father    Sleep apnea Father    Breast cancer Other    Breast cancer Sister 17       37 - myRisk (25 gene) negative. Treated with lumpectomy and radiation   Heart disease Maternal Uncle        d.75   Heart disease Paternal Uncle    Leukemia Maternal Grandmother        d.70s -shortly after diagnosis   Pancreatic cancer Paternal Grandmother 38       d.70s   Colon cancer Neg Hx    Esophageal cancer Neg Hx    Rectal cancer  Neg Hx    Stomach cancer Neg Hx     SOCIAL HISTORY: Social History   Socioeconomic History   Marital status: Married    Spouse name: Not on file   Number of children: 2   Years of education: Not on file   Highest education level: Not on file  Occupational History   Occupation: music teacher    Employer: [employer]  Tobacco Use   Smoking status: Never   Smokeless tobacco: Never  Vaping Use   Vaping status: Never Used  Substance and Sexual Activity   Alcohol use: Yes    Alcohol/week: 3.0 standard drinks of alcohol    Types: 3 Standard drinks or equivalent per week    Comment: 0-2 drinks/week   Drug use: No   Sexual activity: Yes    Partners: Male    Birth control/protection: I.U.D.  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 2 daughters. Married. 2 outside dogs.   Hale Bogus is at [employer] is a senior   Social Determinants of Corporate investment banker Strain: Not on [employer] Insecurity: Not on file  Transportation Needs: Not on file  Physical Activity: Not on file  Stress: Not on file  Social Connections: Unknown (09/30/2021)   [facility] from [facility], [facility]   Social Network    Social Network: Not on file  Intimate Partner Violence: Unknown (08/22/2021)   Received from [facility], [facility]   HITS    Physically Hurt: Not on file    Insult or Talk Down To: Not on file    Threaten Physical Harm: Not on file    Scream or Curse: Not on file     PHYSICAL EXAM  GENERAL EXAM/CONSTITUTIONAL: Vitals:  Vitals:   02/26/23 1139  BP: 126/82  Pulse: 62  Weight: 177 lb (80.3 kg)  Height: 5\' 5"  (1.651 m)   Body mass index is 29.45 kg/m. Wt Readings from Last 3 Encounters:  02/26/23 177 lb (80.3 kg)  03/25/21 173 lb (78.5 kg)  05/09/20 162 lb (73.5 kg)   Patient is in no distress; well developed, nourished and groomed; neck is  supple  CARDIOVASCULAR: Examination of carotid arteries is normal; no carotid bruits Regular rate and rhythm, no murmurs Examination of peripheral vascular system by observation and palpation is normal  EYES: Ophthalmoscopic exam of optic discs and posterior segments is normal; no papilledema or hemorrhages No results found.  MUSCULOSKELETAL: Gait, strength, tone, movements noted in Neurologic exam below  NEUROLOGIC: MENTAL STATUS:      No data to display         awake, alert, oriented to person, place and time recent and remote memory intact normal attention and concentration language fluent, comprehension intact, naming intact fund of knowledge appropriate  CRANIAL NERVE:  2nd - no papilledema on fundoscopic exam 2nd, 3rd, 4th, 6th - pupils equal and reactive to light, visual fields full to confrontation, extraocular muscles intact, no nystagmus 5th - facial sensation symmetric 7th - facial strength symmetric 8th - hearing intact 9th - palate elevates symmetrically, uvula midline 11th - shoulder shrug symmetric 12th - tongue protrusion midline  MOTOR:  normal bulk and tone, full strength in the BUE, BLE  SENSORY:  normal and symmetric to light touch, temperature, vibration  COORDINATION:  finger-nose-finger, fine finger movements normal  REFLEXES:  deep tendon reflexes present and symmetric  GAIT/STATION:  narrow based gait     DIAGNOSTIC DATA (LABS, IMAGING, TESTING) - I reviewed patient records, labs, notes, testing and imaging myself where available.  Lab Results  Component Value Date   WBC 9.1 03/25/2021   HGB 14.1 03/25/2021   HCT 40.9 03/25/2021   MCV 89.5 03/25/2021   PLT 224 03/25/2021      Component Value Date/Time   NA 142 03/25/2021 1637   NA 140 03/11/2020 1645   K 4.0 03/25/2021 1637   CL 107 03/25/2021 1637   CO2 24 03/25/2021 1637   GLUCOSE 60 (L) 03/25/2021 1637   BUN 12 03/25/2021 1637   BUN 14 03/11/2020 1645    CREATININE 0.80 03/25/2021 1637   CALCIUM 9.4 03/25/2021 1637   PROT 7.1 03/25/2021 1637   PROT 6.4 03/11/2020 1645   ALBUMIN 4.3 03/11/2020 1645   AST 18 03/25/2021 1637   ALT 13 03/25/2021 1637   ALKPHOS 92 03/11/2020 1645   BILITOT 0.3 03/25/2021 1637   BILITOT <0.2 03/11/2020 1645   GFRNONAA 88 03/11/2020 1645   GFRAA 102 03/11/2020 1645   Lab Results  Component Value Date   CHOL 219 (H) 03/25/2021   HDL 55 03/25/2021   LDLCALC 128 (H) 03/25/2021   TRIG 214 (H) 03/25/2021   CHOLHDL 4.0 03/25/2021   No results found for: "HGBA1C" No results found for: "VITAMINB12" Lab Results  Component Value Date   TSH 2.29 03/25/2021       ASSESSMENT AND PLAN  57 y.o. year old female here with:  Meds tried: amitriptyline, atenolol, sumatriptan, ubrelvy, nurtec, topiramate, qulipta    Dx:  1. Migraine without aura and without status migrainosus, not intractable       PLAN:  MIGRAINE WITHOUT AURA - continue qulipita 60mg  daily -  MIGRAINE TREATMENT PLAN:  MIGRAINE PREVENTION  LIFESTYLE CHANGES -Stop or avoid smoking -Decrease or avoid caffeine / alcohol -Eat and sleep on a regular schedule -Exercise several times per week - continue qulipta 60mg  daily (working well; now down to 1 day migraine per week)  Consider  - erenumab (Aimovig) 70mg  monthly (may increase to 140mg  monthly) - fremanezumab (Ajovy) 225mg  monthly (or 675mg  every 3 months) - galazanezumab (Emgality) 240mg  loading dose; then 120mg  monthly  MIGRAINE RESCUE  - ibuprofen, tylenol as needed - consider rizatriptan (Maxalt) 10mg  as needed for breakthrough headache; may repeat x 1 after 2 hours; max 2 tabs per day or 8 per month - consider rimegepant (Nurtec) 75mg  as needed for breakthrough headache; max 8 per month  Return for pending if symptoms worsen or fail to improve.  Suanne Marker, MD 02/26/2023, 12:20 PM Certified in Neurology, Neurophysiology and Neuroimaging  [facility] [address], [address] [place], [place] [address] [phone]

## 2023-02-26 NOTE — Patient Instructions (Addendum)
  MIGRAINE PREVENTION  LIFESTYLE CHANGES -Stop or avoid smoking -Decrease or avoid caffeine / alcohol -Eat and sleep on a regular schedule -Exercise several times per week - continue qulipta 60mg  daily (working well; now down to 1 day migraine per week)  Consider  - erenumab (Aimovig) 70mg  monthly (may increase to 140mg  monthly) - fremanezumab (Ajovy) 225mg  monthly (or 675mg  every 3 months) - galazanezumab (Emgality) 240mg  loading dose; then 120mg  monthly  MIGRAINE RESCUE  - ibuprofen, tylenol as needed - consider rizatriptan (Maxalt) 10mg  as needed for breakthrough headache; may repeat x 1 after 2 hours; max 2 tabs per day or 8 per month - consider rimegepant (Nurtec) 75mg  as needed for breakthrough headache; max 8 per month

## 2023-03-04 ENCOUNTER — Ambulatory Visit: Payer: BC Managed Care – PPO | Admitting: Neurology

## 2023-04-29 ENCOUNTER — Encounter: Payer: Self-pay | Admitting: Genetic Counselor

## 2023-04-29 DIAGNOSIS — Z1589 Genetic susceptibility to other disease: Secondary | ICD-10-CM | POA: Insufficient documentation

## 2023-05-25 ENCOUNTER — Encounter: Payer: Self-pay | Admitting: Obstetrics and Gynecology

## 2023-05-25 ENCOUNTER — Ambulatory Visit (INDEPENDENT_AMBULATORY_CARE_PROVIDER_SITE_OTHER): Payer: 59 | Admitting: Obstetrics and Gynecology

## 2023-05-25 ENCOUNTER — Other Ambulatory Visit (HOSPITAL_COMMUNITY)
Admission: RE | Admit: 2023-05-25 | Discharge: 2023-05-25 | Disposition: A | Discharge status: Home / Self Care | Payer: 59 | Source: Ambulatory Visit | Attending: Obstetrics and Gynecology | Admitting: Obstetrics and Gynecology

## 2023-05-25 VITALS — BP 138/78 | HR 72 | Ht 65.75 in | Wt 163.0 lb

## 2023-05-25 DIAGNOSIS — N951 Menopausal and female climacteric states: Secondary | ICD-10-CM | POA: Insufficient documentation

## 2023-05-25 DIAGNOSIS — Z01419 Encounter for gynecological examination (general) (routine) without abnormal findings: Secondary | ICD-10-CM | POA: Insufficient documentation

## 2023-05-25 DIAGNOSIS — Z1589 Genetic susceptibility to other disease: Secondary | ICD-10-CM | POA: Diagnosis not present

## 2023-05-25 DIAGNOSIS — Z1239 Encounter for other screening for malignant neoplasm of breast: Secondary | ICD-10-CM

## 2023-05-25 DIAGNOSIS — Z124 Encounter for screening for malignant neoplasm of cervix: Secondary | ICD-10-CM

## 2023-05-25 DIAGNOSIS — Z9189 Other specified personal risk factors, not elsewhere classified: Secondary | ICD-10-CM

## 2023-05-25 NOTE — Assessment & Plan Note (Addendum)
 Discussed nonhormonal options for VMS, including SSRIs and veozah. Patient discontinued celexa  due to GI side effects. Veozah is contraindicated in patient with liver dysfunction and required q3 months CMP side effects include abdominal pain, diarrhea, back pain, and insomnia. Also discussed gabapentin for off label use of hot flashes. Patient undecided at this time. To consider options.

## 2023-05-25 NOTE — Progress Notes (Signed)
 58 y.o. [id number] postmenopausal female with CHEK2 mutation (annual breast MRI and mammography), family history of breast cancer here for annual exam. Married.  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  Pt c/o frequent hot flashes, refuses HRT due to breast cancer risk.  Having issues with insurance covering MRI. Taking MiraLAX for constipation.  Patient's last menstrual period was 03/15/2020.   Abnormal bleeding: None Pelvic discharge or pain: None Breast mass, nipple discharge or skin changes : None  Last PAP:     Component Value Date/Time   DIAGPAP  03/13/2020 0854    - Negative for intraepithelial lesion or malignancy (NILM)   DIAGPAP  12/03/2016 0000    NEGATIVE FOR INTRAEPITHELIAL LESIONS OR MALIGNANCY.   HPVHIGH Negative 03/13/2020 0854   ADEQPAP  03/13/2020 0854    Satisfactory for evaluation; transformation zone component PRESENT.   ADEQPAP  12/03/2016 0000    Satisfactory for evaluation  endocervical/transformation zone component PRESENT.   MMG: 10/14/2022 BI-RADS 1, density B BMD: n/a  Colonoscopy: 05/09/2020 , q 56yr Sexually active: Yes Exercising: Yes, walk Smoker: No PHQ-9: 5, overall doing well, has a therapist that she sees on ocassion  GYN HISTORY: No significant history  OB History  Gravida Para Term Preterm AB Living  2 2 2   2   SAB IAB Ectopic Multiple Live Births          # Outcome Date GA Lbr Len/2nd Weight Sex Type Anes PTL Lv  2 Term           1 Term             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   Allergy    Anxiety and depression    At high risk for breast cancer    CHEK2 gene mutation   At risk for colon cancer    CHEK2 gene mutation   Family history of breast cancer    GERD (gastroesophageal reflux disease)    Migraine    in her 20's, improved after having children    Past Surgical  History:  Procedure Laterality Date   BREAST BIOPSY Left 01/02/2013   calcifications and FBC   BREAST BIOPSY Right 11/2018   fibroadenoma   INTRAUTERINE DEVICE INSERTION     inserted 7/09   TONSILLECTOMY AND ADENOIDECTOMY      Current Outpatient Medications on File Prior to Visit  Medication Sig Dispense Refill   aspirin-acetaminophen-caffeine (EXCEDRIN MIGRAINE) 250-250-65 MG per tablet Take 1 tablet by mouth every 6 (six) hours as needed.     cetirizine (ZYRTEC) 10 MG tablet Take 10 mg by mouth daily.     Cholecalciferol (VITAMIN D3) 50 MCG (2000 UT) TABS Take 2,000 Units by mouth daily.     ibuprofen (ADVIL,MOTRIN) 200 MG tablet Take 200 mg by mouth every 6 (six) hours as needed.     Multiple Vitamin (MULTI-VITAMIN) tablet Take 1 tablet by mouth daily.     OMEPRAZOLE PO Take by mouth.     polyethylene glycol (MIRALAX / GLYCOLAX) 17 g packet Take 17 g by mouth daily.     QULIPTA 60 MG TABS Take 1 tablet by mouth daily.     No current facility-administered medications on file prior to visit.    Social History   Socioeconomic History   Marital status: Married    Spouse name: Not on file  Number of children: 2   Years of education: Not on file   Highest education level: Not on file  Occupational History   Occupation: music teacher    Employer: [employer]  Tobacco Use   Smoking status: Never   Smokeless tobacco: Never  Vaping Use   Vaping status: Never Used  Substance and Sexual Activity   Alcohol use: Yes    Alcohol/week: 3.0 standard drinks of alcohol    Types: 3 Standard drinks or equivalent per week    Comment: 0-2 drinks/week   Drug use: No   Sexual activity: Yes    Partners: Male    Birth control/protection: Post-menopausal  Other Topics Concern   Not on file  Social History Narrative   Teaches at [employer]. 2 daughters. Married. 2 outside dogs.   Elonda is at [employer] is a Art Therapist Drivers of  Corporate Investment Banker Strain: Not on [employer] Insecurity: Not on file  Transportation Needs: Not on file  Physical Activity: Not on file  Stress: Not on file  Social Connections: Unknown (09/30/2021)   [facility] from [facility], [facility]   Social Network    Social Network: Not on file  Intimate Partner Violence: Unknown (08/22/2021)   Received from [facility], [facility]   HITS    Physically Hurt: Not on file    Insult or Talk Down To: Not on file    Threaten Physical Harm: Not on file    Scream or Curse: Not on file    Family History  Problem Relation Age of Onset   Hypertension Mother    Breast cancer Mother 18       treated with lumpectomy and radiation   Thyroid disease Mother    Hyperlipidemia Mother    Hyperlipidemia Father    Hypertension Father    Sleep apnea Father    Breast cancer Other    Breast cancer Sister 53       47 - myRisk (25 gene) negative. Treated with lumpectomy and radiation   Heart disease Maternal Uncle        d.75   Heart disease Paternal Uncle    Leukemia Maternal Grandmother        d.70s -shortly after diagnosis   Pancreatic cancer Paternal Grandmother 41       d.70s   Colon cancer Neg Hx    Esophageal cancer Neg Hx    Rectal cancer Neg Hx    Stomach cancer Neg Hx     No Known Allergies    PE Today's Vitals   05/25/23 1513  BP: 138/78  Pulse: 72  SpO2: 98%  Weight: 163 lb (73.9 kg)  Height: 5' 5.75 (1.67 m)   Body mass index is 26.51 kg/m.  Physical Exam Vitals reviewed. Exam conducted with a chaperone present.  Constitutional:      General: She is not in acute distress.    Appearance: Normal appearance.  HENT:     Head: Normocephalic and atraumatic.     Nose: Nose normal.  Eyes:     Extraocular Movements: Extraocular movements intact.     Conjunctiva/sclera: Conjunctivae normal.  Neck:     Thyroid: No thyroid mass, thyromegaly or thyroid tenderness.  Pulmonary:     Effort: Pulmonary effort is  normal.  Chest:     Chest wall: No mass or tenderness.  Breasts:    Right: Normal. No swelling, mass, nipple  discharge, skin change or tenderness.     Left: Normal. No swelling, mass, nipple discharge, skin change or tenderness.  Abdominal:     General: There is no distension.     Palpations: Abdomen is soft.     Tenderness: There is no abdominal tenderness.  Genitourinary:    General: Normal vulva.     Exam position: Lithotomy position.     Urethra: No prolapse.     Vagina: Normal. No vaginal discharge or bleeding.     Cervix: Normal. No lesion.     Uterus: Normal. Not enlarged and not tender.      Adnexa: Right adnexa normal and left adnexa normal.  Musculoskeletal:        General: Normal range of motion.     Cervical back: Normal range of motion.  Lymphadenopathy:     Upper Body:     Right upper body: No axillary adenopathy.     Left upper body: No axillary adenopathy.     Lower Body: No right inguinal adenopathy. No left inguinal adenopathy.  Skin:    General: Skin is warm and dry.  Neurological:     General: No focal deficit present.     Mental Status: She is alert.  Psychiatric:        Mood and Affect: Mood normal.        Behavior: Behavior normal.      Assessment and Plan:        Well woman exam with routine gynecological exam Assessment & Plan: Cervical cancer screening performed according to ASCCP guidelines. Encouraged annual mammogram screening Colonoscopy UTD DXA N/A Labs and immunizations with her primary Encouraged safe sexual practices as indicated Encouraged healthy lifestyle practices with diet and exercise For patients under 50-70yo, I recommend 1200mg  calcium daily and 600IU of vitamin D  daily.    Cervical cancer screening -     Cytology - PAP  Vasomotor symptoms due to menopause Assessment & Plan: Discussed nonhormonal options for VMS, including SSRIs and veozah. Patient discontinued celexa  due to GI side effects. Veozah is contraindicated  in patient with liver dysfunction and required q3 months CMP side effects include abdominal pain, diarrhea, back pain, and insomnia. Also discussed gabapentin for off label use of hot flashes. Patient undecided at this time. To consider options.   CHEK2 positive -     MR BREAST BILATERAL W WO CONTRAST INC CAD; Future  At high risk for breast cancer -     MR BREAST BILATERAL W WO CONTRAST INC CAD; Future  Encounter for breast cancer screening using non-mammogram modality -     MR BREAST BILATERAL W WO CONTRAST INC CAD; Future  Vera LULLA Pa, MD

## 2023-05-25 NOTE — Assessment & Plan Note (Signed)
 Cervical cancer screening performed according to ASCCP guidelines. Encouraged annual mammogram screening Colonoscopy UTD DXA N/A Labs and immunizations with her primary Encouraged safe sexual practices as indicated Encouraged healthy lifestyle practices with diet and exercise For patients under 50-57yo, I recommend 1200mg  calcium daily and 600IU of vitamin D daily.

## 2023-05-25 NOTE — Patient Instructions (Addendum)
 For patients under 50-57yo, I recommend 1200mg  calcium  daily and 600IU of vitamin D daily. For patients over 57yo, I recommend 1200mg  calcium  daily and 800IU of vitamin D daily.  Health Maintenance, Female Adopting a healthy lifestyle and getting preventive care are important in promoting health and wellness. Ask your health care provider about: The right schedule for you to have regular tests and exams. Things you can do on your own to prevent diseases and keep yourself healthy. What should I know about diet, weight, and exercise? Eat a healthy diet  Eat a diet that includes plenty of vegetables, fruits, low-fat dairy products, and lean protein. Do not eat a lot of foods that are high in solid fats, added sugars, or sodium. Maintain a healthy weight Body mass index (BMI) is used to identify weight problems. It estimates body fat based on height and weight. Your health care provider can help determine your BMI and help you achieve or maintain a healthy weight. Get regular exercise Get regular exercise. This is one of the most important things you can do for your health. Most adults should: Exercise for at least 150 minutes each week. The exercise should increase your heart rate and make you sweat (moderate-intensity exercise). Do strengthening exercises at least twice a week. This is in addition to the moderate-intensity exercise. Spend less time sitting. Even light physical activity can be beneficial. Watch cholesterol and blood lipids Have your blood tested for lipids and cholesterol at 57 years of age, then have this test every 5 years. Have your cholesterol levels checked more often if: Your lipid or cholesterol levels are high. You are older than 57 years of age. You are at high risk for heart disease. What should I know about cancer screening? Depending on your health history and family history, you may need to have cancer screening at various ages. This may include screening  for: Breast cancer. Cervical cancer. Colorectal cancer. Skin cancer. Lung cancer. What should I know about heart disease, diabetes, and high blood pressure? Blood pressure and heart disease High blood pressure causes heart disease and increases the risk of stroke. This is more likely to develop in people who have high blood pressure readings or are overweight. Have your blood pressure checked: Every 3-5 years if you are 25-57 years of age. Every year if you are 24 years old or older. Diabetes Have regular diabetes screenings. This checks your fasting blood sugar level. Have the screening done: Once every three years after age 62 if you are at a normal weight and have a low risk for diabetes. More often and at a younger age if you are overweight or have a high risk for diabetes. What should I know about preventing infection? Hepatitis B If you have a higher risk for hepatitis B, you should be screened for this virus. Talk with your health care provider to find out if you are at risk for hepatitis B infection. Hepatitis C Testing is recommended for: Everyone born from [birth year] through 1964. Anyone with known risk factors for hepatitis C. Sexually transmitted infections (STIs) Get screened for STIs, including gonorrhea and chlamydia, if: You are sexually active and are younger than 57 years of age. You are older than 57 years of age and your health care provider tells you that you are at risk for this type of infection. Your sexual activity has changed since you were last screened, and you are at increased risk for chlamydia or gonorrhea. Ask your health care provider if  you are at risk. Ask your health care provider about whether you are at high risk for HIV. Your health care provider may recommend a prescription medicine to help prevent HIV infection. If you choose to take medicine to prevent HIV, you should first get tested for HIV. You should then be tested every 3 months for as long as you  are taking the medicine. Osteoporosis and menopause Osteoporosis is a disease in which the bones lose minerals and strength with aging. This can result in bone fractures. If you are 72 years old or older, or if you are at risk for osteoporosis and fractures, ask your health care provider if you should: Be screened for bone loss. Take a calcium  or vitamin D supplement to lower your risk of fractures. Be given hormone replacement therapy (HRT) to treat symptoms of menopause. Follow these instructions at home: Alcohol use Do not drink alcohol if: Your health care provider tells you not to drink. You are pregnant, may be pregnant, or are planning to become pregnant. If you drink alcohol: Limit how much you have to: 0-1 drink a day. Know how much alcohol is in your drink. In the [place] one drink equals one 12 oz bottle of beer (355 mL), one 5 oz glass of wine (148 mL), or one 1 oz glass of hard liquor (44 mL). Lifestyle Do not use any products that contain nicotine or tobacco. These products include cigarettes, chewing tobacco, and vaping devices, such as e-cigarettes. If you need help quitting, ask your health care provider. Do not use street drugs. Do not share needles. Ask your health care provider for help if you need support or information about quitting drugs. General instructions Schedule regular health, dental, and eye exams. Stay current with your vaccines. Tell your health care provider if: You often feel depressed. You have ever been abused or do not feel safe at home. Summary Adopting a healthy lifestyle and getting preventive care are important in promoting health and wellness. Follow your health care provider's instructions about healthy diet, exercising, and getting tested or screened for diseases. Follow your health care provider's instructions on monitoring your cholesterol and blood pressure. This information is not intended to replace advice given to you by your health  care provider. Make sure you discuss any questions you have with your health care provider. Document Revised: 09/23/2020 Document Reviewed: 09/23/2020 [employer] Patient Education  2024 [employer].

## 2023-05-27 LAB — CYTOLOGY - PAP
Comment: NEGATIVE
Diagnosis: NEGATIVE
High risk HPV: NEGATIVE

## 2023-12-08 ENCOUNTER — Other Ambulatory Visit: Payer: Self-pay | Admitting: Obstetrics and Gynecology

## 2023-12-08 DIAGNOSIS — Z1231 Encounter for screening mammogram for malignant neoplasm of breast: Secondary | ICD-10-CM

## 2023-12-22 ENCOUNTER — Ambulatory Visit
Admission: RE | Admit: 2023-12-22 | Discharge: 2023-12-22 | Disposition: A | Discharge status: Home / Self Care | Source: Ambulatory Visit | Attending: Obstetrics and Gynecology

## 2023-12-22 DIAGNOSIS — Z1231 Encounter for screening mammogram for malignant neoplasm of breast: Secondary | ICD-10-CM

## 2023-12-27 ENCOUNTER — Ambulatory Visit: Payer: Self-pay | Admitting: Obstetrics and Gynecology
